# Patient Record
Sex: Female | Born: 1960 | Race: White | Hispanic: No | Marital: Married | Smoking: Never smoker
Health system: Southern US, Community
[De-identification: ages and names within clinical notes are randomized; demographics above are authoritative.]

## PROBLEM LIST (undated history)

## (undated) DIAGNOSIS — K219 Gastro-esophageal reflux disease without esophagitis: Secondary | ICD-10-CM

## (undated) DIAGNOSIS — B019 Varicella without complication: Secondary | ICD-10-CM

## (undated) DIAGNOSIS — L4050 Arthropathic psoriasis, unspecified: Secondary | ICD-10-CM

## (undated) DIAGNOSIS — M1990 Unspecified osteoarthritis, unspecified site: Secondary | ICD-10-CM

## (undated) DIAGNOSIS — H409 Unspecified glaucoma: Secondary | ICD-10-CM

## (undated) DIAGNOSIS — R7303 Prediabetes: Secondary | ICD-10-CM

## (undated) DIAGNOSIS — R599 Enlarged lymph nodes, unspecified: Secondary | ICD-10-CM

## (undated) HISTORY — DX: Arthropathic psoriasis, unspecified: L40.50

## (undated) HISTORY — DX: Gastro-esophageal reflux disease without esophagitis: K21.9

## (undated) HISTORY — DX: Enlarged lymph nodes, unspecified: R59.9

## (undated) HISTORY — DX: Varicella without complication: B01.9

## (undated) HISTORY — DX: Unspecified osteoarthritis, unspecified site: M19.90

---

## 1988-09-14 HISTORY — PX: OTHER SURGICAL HISTORY: SHX169

## 2000-01-09 ENCOUNTER — Encounter: Admission: RE | Admit: 2000-01-09 | Discharge: 2000-01-09 | Payer: Self-pay | Admitting: Family Medicine

## 2000-01-09 ENCOUNTER — Encounter: Payer: Self-pay | Admitting: Family Medicine

## 2000-10-29 ENCOUNTER — Encounter: Admission: RE | Admit: 2000-10-29 | Discharge: 2000-10-29 | Payer: Self-pay | Admitting: Family Medicine

## 2000-10-29 ENCOUNTER — Encounter: Payer: Self-pay | Admitting: Family Medicine

## 2000-11-30 ENCOUNTER — Other Ambulatory Visit: Admission: RE | Admit: 2000-11-30 | Discharge: 2000-11-30 | Payer: Self-pay | Admitting: Obstetrics and Gynecology

## 2002-04-18 ENCOUNTER — Encounter: Payer: Self-pay | Admitting: Family Medicine

## 2002-04-18 ENCOUNTER — Encounter: Admission: RE | Admit: 2002-04-18 | Discharge: 2002-04-18 | Payer: Self-pay | Admitting: Family Medicine

## 2002-09-14 HISTORY — PX: ANTERIOR CRUCIATE LIGAMENT REPAIR: SHX115

## 2004-09-14 HISTORY — PX: ANTERIOR CRUCIATE LIGAMENT REPAIR: SHX115

## 2005-01-19 ENCOUNTER — Encounter (INDEPENDENT_AMBULATORY_CARE_PROVIDER_SITE_OTHER): Payer: Self-pay | Admitting: *Deleted

## 2005-03-10 ENCOUNTER — Encounter: Admission: RE | Admit: 2005-03-10 | Discharge: 2005-03-10 | Payer: Self-pay | Admitting: Family Medicine

## 2005-10-20 ENCOUNTER — Encounter: Admission: RE | Admit: 2005-10-20 | Discharge: 2005-10-20 | Payer: Self-pay | Admitting: Internal Medicine

## 2006-10-22 ENCOUNTER — Encounter (INDEPENDENT_AMBULATORY_CARE_PROVIDER_SITE_OTHER): Payer: Self-pay | Admitting: *Deleted

## 2006-10-22 ENCOUNTER — Ambulatory Visit (HOSPITAL_COMMUNITY): Admission: RE | Admit: 2006-10-22 | Discharge: 2006-10-22 | Payer: Self-pay

## 2009-04-23 LAB — HM MAMMOGRAPHY: HM Mammogram: NORMAL

## 2009-07-17 ENCOUNTER — Ambulatory Visit: Payer: Self-pay | Admitting: Family Medicine

## 2009-07-17 DIAGNOSIS — R599 Enlarged lymph nodes, unspecified: Secondary | ICD-10-CM

## 2009-07-17 DIAGNOSIS — L4050 Arthropathic psoriasis, unspecified: Secondary | ICD-10-CM | POA: Insufficient documentation

## 2009-07-17 HISTORY — DX: Enlarged lymph nodes, unspecified: R59.9

## 2009-07-17 HISTORY — DX: Arthropathic psoriasis, unspecified: L40.50

## 2009-07-22 ENCOUNTER — Encounter (INDEPENDENT_AMBULATORY_CARE_PROVIDER_SITE_OTHER): Payer: Self-pay | Admitting: *Deleted

## 2009-08-13 ENCOUNTER — Encounter (INDEPENDENT_AMBULATORY_CARE_PROVIDER_SITE_OTHER): Payer: Self-pay | Admitting: *Deleted

## 2009-09-14 HISTORY — PX: COLONOSCOPY: SHX174

## 2010-01-15 ENCOUNTER — Ambulatory Visit: Payer: Self-pay | Admitting: Family Medicine

## 2010-01-15 DIAGNOSIS — S5010XA Contusion of unspecified forearm, initial encounter: Secondary | ICD-10-CM | POA: Insufficient documentation

## 2010-05-14 ENCOUNTER — Encounter (INDEPENDENT_AMBULATORY_CARE_PROVIDER_SITE_OTHER): Payer: Self-pay | Admitting: *Deleted

## 2010-07-17 ENCOUNTER — Encounter (INDEPENDENT_AMBULATORY_CARE_PROVIDER_SITE_OTHER): Payer: Self-pay | Admitting: *Deleted

## 2010-07-21 ENCOUNTER — Ambulatory Visit: Payer: Self-pay | Admitting: Gastroenterology

## 2010-08-01 ENCOUNTER — Ambulatory Visit: Payer: Self-pay | Admitting: Gastroenterology

## 2010-10-14 NOTE — Letter (Signed)
Summary: Moviprep Instructions  [facility]  [address]   [place], [place] [address]   Phone: [phone]  Fax: [phone]       Abigail Hayes    07-29-1961    MRN: [id number]        Procedure Day Dorna Bloom: Friday, 08-01-10     Arrival Time: 9:00 a.m.     Procedure Time: 10:00 a.m.     Location of Procedure:                    x  [facility] (4th Floor)                        PREPARATION FOR COLONOSCOPY WITH MOVIPREP   Starting 5 days prior to your procedure 07-27-10 do not eat nuts, seeds, popcorn, corn, beans, peas,  salads, or any raw vegetables.  Do not take any fiber supplements (e.g. Metamucil, Citrucel, and Benefiber).  THE DAY BEFORE YOUR PROCEDURE         DATE: 07-31-10   DAY: Thursday  1.  Drink clear liquids the entire day-NO SOLID FOOD  2.  Do not drink anything colored red or purple.  Avoid juices with pulp.  No orange juice.  3.  Drink at least 64 oz. (8 glasses) of fluid/clear liquids during the day to prevent dehydration and help the prep work efficiently.  CLEAR LIQUIDS INCLUDE: Water Jello Ice Popsicles Tea (sugar ok, no milk/cream) Powdered fruit flavored drinks Coffee (sugar ok, no milk/cream) Gatorade Juice: apple, white grape, white cranberry  Lemonade Clear bullion, consomm, broth Carbonated beverages (any kind) Strained chicken noodle soup Hard Candy                             4.  In the morning, mix first dose of MoviPrep solution:    Empty 1 Pouch A and 1 Pouch B into the disposable container    Add lukewarm drinking water to the top line of the container. Mix to dissolve    Refrigerate (mixed solution should be used within 24 hrs)  5.  Begin drinking the prep at 5:00 p.m. The MoviPrep container is divided by 4 marks.   Every 15 minutes drink the solution down to the next mark (approximately 8 oz) until the full liter is complete.   6.  Follow completed prep with 16 oz of clear liquid of your choice  (Nothing red or purple).  Continue to drink clear liquids until bedtime.  7.  Before going to bed, mix second dose of MoviPrep solution:    Empty 1 Pouch A and 1 Pouch B into the disposable container    Add lukewarm drinking water to the top line of the container. Mix to dissolve    Refrigerate  THE DAY OF YOUR PROCEDURE      DATE: 08-01-10  DAY: Friday  Beginning at 5:00 a.m. (5 hours before procedure):         1. Every 15 minutes, drink the solution down to the next mark (approx 8 oz) until the full liter is complete.  2. Follow completed prep with 16 oz. of clear liquid of your choice.    3. You Altamura drink clear liquids until  8:00 a.m.  (2 HOURS BEFORE PROCEDURE).   MEDICATION INSTRUCTIONS  Unless otherwise instructed, you should take regular prescription medications with a small sip of water   as early as possible  the morning of your procedure.          OTHER INSTRUCTIONS  You will need a responsible adult at least 51 years of age to accompany you and drive you home.   This person must remain in the waiting room during your procedure.  Wear loose fitting clothing that is easily removed.  Leave jewelry and other valuables at home.  However, you Masini wish to bring a book to read or  an iPod/MP3 player to listen to music as you wait for your procedure to start.  Remove all body piercing jewelry and leave at home.  Total time from sign-in until discharge is approximately 2-3 hours.  You should go home directly after your procedure and rest.  You can resume normal activities the  day after your procedure.  The day of your procedure you should not:   Drive   Make legal decisions   Operate machinery   Drink alcohol   Return to work  You will receive specific instructions about eating, activities and medications before you leave.    The above instructions have been reviewed and explained to me by   Ezra Sites RN  July 21, 2010 4:45 PM     I fully  understand and can verbalize these instructions _____________________________ Date _________

## 2010-10-14 NOTE — Procedures (Signed)
Summary: Colonoscopy  Patient: Cynde Catlin Note: All result statuses are Final unless otherwise noted.  Tests: (1) Colonoscopy (COL)   COL Colonoscopy           DONE     [facility]     [address]     [place], [place]  [address]           COLONOSCOPY PROCEDURE REPORT           PATIENT:  Selmon, Naraly  MR#:  [id number]     BIRTHDATE:  02-15-1961, 49 yrs. old  GENDER:  female     ENDOSCOPIST:  Rachael Fee, MD     REF. BY:  Harold Hedge, M.D.     PROCEDURE DATE:  08/01/2010     PROCEDURE:  Diagnostic Colonoscopy     ASA CLASS:  Class II     INDICATIONS:  Routine Risk Screening     MEDICATIONS:   Fentanyl 75 mcg IV, Versed 9 mg IV           DESCRIPTION OF PROCEDURE:   After the risks benefits and     alternatives of the procedure were thoroughly explained, informed     consent was obtained.  Digital rectal exam was performed and     revealed no rectal masses.   The LB 180AL [id number] endoscope was     introduced through the anus and advanced to the cecum, which was     identified by both the appendix and ileocecal valve, without     limitations.  The quality of the prep was good, using MoviPrep.     The instrument was then slowly withdrawn as the colon was fully     examined.     <<PROCEDUREIMAGES>>     FINDINGS:  External hemorrhoids were found. These were small and     not thrombosed.  This was otherwise a normal examination of the     colon (see image2, image3, and image4).   Retroflexed views in the     rectum revealed no abnormalities.    The scope was then withdrawn     from the patient and the procedure completed.     COMPLICATIONS:  None           ENDOSCOPIC IMPRESSION:     1) External hemorrhoids     2) Otherwise normal examination; no polyps or cancers           RECOMMENDATIONS:     1) You should continue to follow colorectal cancer screening     guidelines for "routine risk" patients with a repeat colonoscopy     in 10 years. There is no need for  FOBT (stool) testing for at     least 5 years.           REPEAT EXAM:  10 years           ______________________________     Rachael Fee, MD           n.     eSIGNED:   Rachael Fee at 08/01/2010 10:04 AM           Merian Capron, [phone]  Note: An exclamation mark (!) indicates a result that was not dispersed into the flowsheet. Document Creation Date: 08/01/2010 10:04 AM _______________________________________________________________________  (1) Order result status: Final Collection or observation date-time: 08/01/2010 10:01 Requested date-time:  Receipt date-time:  Reported date-time:  Referring Physician:   Ordering Physician: Rob Bunting [phone]) Specimen  Source:  Source: Launa Grill Order Number: [id number] Lab site:   Appended Document: Colonoscopy    Clinical Lists Changes  Observations: Added new observation of COLONNXTDUE: 07/2020 (08/01/2010 12:14)

## 2010-10-14 NOTE — Letter (Signed)
Summary: Pre Visit Letter Revised  [facility]  [address] [place], [place] [address]   Phone: [phone]  Fax: [phone]        05/14/2010 MRN: [id number] Abigail Hayes [address] [place], [place]  [address]             Procedure Date:  06/03/2010   Welcome to the Gastroenterology Division at [facility].    You are scheduled to see a nurse for your pre-procedure visit on 05/27/2010 at 3:30PM on the 3rd floor at [facility], [address]  We ask that you try to arrive at our office 15 minutes prior to your appointment time to allow for check-in.  Please take a minute to review the attached form.  If you answer "Yes" to one or more of the questions on the first page, we ask that you call the person listed at your earliest opportunity.  If you answer "No" to all of the questions, please complete the rest of the form and bring it to your appointment.    Your nurse visit will consist of discussing your medical and surgical history, your immediate family medical history, and your medications.   If you are unable to list all of your medications on the form, please bring the medication bottles to your appointment and we will list them.  We will need to be aware of both prescribed and over the counter drugs.  We will need to know exact dosage information as well.    Please be prepared to read and sign documents such as consent forms, a financial agreement, and acknowledgement forms.  If necessary, and with your consent, a friend or relative is welcome to sit-in on the nurse visit with you.  Please bring your insurance card so that we [place] make a copy of it.  If your insurance requires a referral to see a specialist, please bring your referral form from your primary care physician.  No co-pay is required for this nurse visit.     If you cannot keep your appointment, please call [phone] to cancel or reschedule prior to your appointment date.  This allows [place]  the opportunity to schedule an appointment for another patient in need of care.    Thank you for choosing [facility] for your medical needs.  We appreciate the opportunity to care for you.  Please visit [place] at our website  to learn more about our practice.  Sincerely, The Gastroenterology Division

## 2010-10-14 NOTE — Miscellaneous (Signed)
Summary: [facility]  Clinical Lists Changes  Medications: Added new medication of MOVIPREP 100 GM  SOLR (PEG-KCL-NACL-NASULF-NA ASC-C) As per prep instructions. - Signed Rx of MOVIPREP 100 GM  SOLR (PEG-KCL-NACL-NASULF-NA ASC-C) As per prep instructions.;  #1 x 0;  Signed;  Entered by: Ezra Sites RN;  Authorized by: Rachael Fee MD;  Method used: [facility]  [place] [address] [place], [place], [place]  [address], Ph: [phone] or [phone], Fax: [phone] Observations: Added new observation of NKA: T (07/21/2010 16:29)    Prescriptions: MOVIPREP 100 GM  SOLR (PEG-KCL-NACL-NASULF-NA ASC-C) As per prep instructions.  #1 x 0   Entered by:   Ezra Sites RN   Authorized by:   Rachael Fee MD   Signed by:   Ezra Sites RN on 07/21/2010   Method used:   [facility]        [facility]  [place] [address] (retail)       [address]       [place], [place]  [address]       Ph: [phone] or [phone]       Fax: [phone]   RxID:   [id number]

## 2010-10-14 NOTE — Assessment & Plan Note (Signed)
Summary: spot on arm/throat pain/njr   Vital Signs:  Patient profile:   50 year old female Menstrual status:  regular Temp:     98.6 degrees F oral BP sitting:   110 / 78  (left arm) Cuff size:   regular  Vitals Entered By: Sid Falcon LPN ([place]  4, 0981 2:41 PM) CC: spot on arm, throat pain   History of Present Illness: Patient seen with asymptomatic spot left forearm. First noted last week and after kayaking. No injury. Initially this looked darker in color now somewhat more reddish. No other rashes noted.  Also had some intermittent anterior neck pains after doing a number of sit-ups over a week ago. No pain with swallowing and no adenopathy noted. Question of small L supraclavicular node several months ago and patient saw ENT surgeon and eventually this resolved after antibiotic treatment. She had nasal laryngoscopy which was unremarkable.  Allergies (verified): No Known Drug Allergies  Past History:  Past Medical History: Last updated: 07/17/2009 Psoriatic Arthritis Chicken pox  Review of Systems  The patient denies anorexia, fever, weight loss, hoarseness, chest pain, syncope, prolonged cough, and headaches.    Physical Exam  General:  Well-developed,well-nourished,in no acute distress; alert,appropriate and cooperative throughout examination Head:  Normocephalic and atraumatic without obvious abnormalities. No apparent alopecia or balding. Eyes:  No corneal or conjunctival inflammation noted. EOMI. Perrla. Funduscopic exam benign, without hemorrhages, exudates or papilledema. Vision grossly normal. Ears:  External ear exam shows no significant lesions or deformities.  Otoscopic examination reveals clear canals, tympanic membranes are intact bilaterally without bulging, retraction, inflammation or discharge. Hearing is grossly normal bilaterally. Mouth:  Oral mucosa and oropharynx without lesions or exudates.  Teeth in good repair. Neck:  No deformities, masses, or  tenderness noted. Chest Wall:  nodes supraclavicular nodes noted Lungs:  Normal respiratory effort, chest expands symmetrically. Lungs are clear to auscultation, no crackles or wheezes. Heart:  Normal rate and regular rhythm. S1 and S2 normal without gallop, murmur, click, rub or other extra sounds. Skin:  left forearm 4 mm macular area of ecchymosis which is nontender.  No pigmentation.   Impression & Recommendations:  Problem # 1:  CONTUSION OF FOREARM (ICD-923.10) Assessment New reassurance given. Be in touch if this is not resolving over the next couple of weeks  Complete Medication List: 1)  Methotrexate 2.5 Mg Tabs (Methotrexate sodium) .... 6 pills per week 2)  Loestrin 1.5/30 (21) 1.5-30 Mg-mcg Tabs (Norethindrone acet-ethinyl est) .... Once daily as directed 3)  Folic Acid 400 Mcg Tabs (Folic acid) .... Once daily 4)  Vitamin D 400 Unit Caps (Cholecalciferol) .... Once daily 5)  Calcium 1500 Mg Tabs (Calcium carbonate) .... Once daily 6)  Womens One Daily Tabs (Multiple vitamins-minerals) .... Once daily

## 2011-01-30 NOTE — H&P (Signed)
NAMEMarland Kitchen  ONETHA, GAFFEY NO.:  [id number]   MEDICAL RECORD NO.:  [id number]          PATIENT TYPE:  AMB   LOCATION:  SDC                           [facility]   PHYSICIAN:  Guy Sandifer. [name], M.D. DATE OF BIRTH:  [birth year]   DATE OF ADMISSION:  DATE OF DISCHARGE:                              HISTORY & PHYSICAL   PREADMISSION HISTORY AND PHYSICAL   CHIEF COMPLAINT:  Heavy menses.   HISTORY OF PRESENT ILLNESS:  This patient is a 50 year old, married  white female, G3, P3, husband is status post vasectomy, who has had  heavy irregular menses.  Sonohystogram in my office on August 26, 2006, revealed the uterus measuring 9.7 x 5.4 x 7.0 cm.  Sonohystogram  is consistent with a 2.2 cm polypoid process in the endometrial cavity.  The ovaries appeared normal.  After discussion of the options, she is  being admitted for hysteroscopy with resectoscope and D&C.  Potential  risks and complications have been discussed with the patient  preoperatively.   PAST MEDICAL HISTORY:  Psoriatic arthritis.   PAST SURGICAL HISTORY:  1. Knee surgery and repair.  2. Laparoscopy with ovarian cystectomy.   MEDICATIONS:  Methotrexate, Celebrex, folic acid.  No known drug  allergies.   SOCIAL HISTORY:  Denied tobacco, alcohol, or drug abuse.   OBSTETRIC HISTORY:  Three vaginal deliveries.   FAMILY HISTORY:  Ovarian cancer in paternal aunt.   REVIEW OF SYSTEMS:  NEURO:  Denies headache.  CARDIAC:  No chest pain.  PULMONARY:  Denies shortness of breath.  GI:  Denies recent changes in  bowel habits.   PHYSICAL EXAMINATION:  LUNGS:  Clear to auscultation.  HEART:  Regular rate and rhythm.  BREASTS:  Not examined.  ABDOMEN:  Soft and nontender without masses.  PELVIC EXAM:  Vulva, vagina, and cervix without lesion, uterus upward  normal size, adnexa nontender without palpable masses.  EXTREMITIES:  Grossly within normal limits.  NEUROLOGICAL EXAM:  Grossly within normal  limits.   ASSESSMENT:  Menorrhagia.   PLAN:  Hysteroscopy with resectoscope, dilatation, and curettage.      Guy Sandifer [name], M.D.  Electronically Signed     JET/MEDQ  D:  10/18/2006  T:  10/18/2006  Job:  [id number]

## 2011-01-30 NOTE — Op Note (Signed)
NAMEMarland Hayes  ROBERT, SUNGA NO.:  [id number]   MEDICAL RECORD NO.:  [id number]          PATIENT TYPE:  AMB   LOCATION:  SDC                           [facility]   PHYSICIAN:  Guy Sandifer. [name], M.D. DATE OF BIRTH:  [birth year]   DATE OF PROCEDURE:  10/22/2006  DATE OF DISCHARGE:                               OPERATIVE REPORT   PREOPERATIVE DIAGNOSIS:  Menorrhagia.   POSTOPERATIVE DIAGNOSIS:  Menorrhagia.   PROCEDURE:  Hysteroscopy with resectoscope dilatation and curettage and  1% Xylocaine paracervical block.   SURGEON:  Guy Sandifer. [name], M.D.   ANESTHESIA:  General with LMA.   ESTIMATED BLOOD LOSS:  Minimal.   INPUTS AND OUTPUTS:  Sorbitol distending media 5 mL deficit.   INDICATIONS AND CONSENT:  This patient is a 50 year old married white  female G3, P3, whose husband is status post vasectomy, with increasingly  irregular and heavy menses.  The details are dictated history and  physical.  Hysteroscope with resectoscope and D&C has been discussed  with the patient preoperatively.  The potential risks and complications  have been discussed preoperatively including but limited to infection,  bowel, bladder, or ureteral damage, bleeding requiring transfusion of  blood products with possible transfusion reaction, HIV and hepatitis  acquisition, DVT, PE, pneumonia, recurrent heavy bleeding, laparoscopy,  laparotomy, and hysterectomy.  All questions have been answered and  consent is signed on the chart.   FINDINGS:  Fallopian tube ostia identified bilaterally.  There is a  prominent bump on the posterior endometrial canal.  No other abnormal  structures are noted.   DESCRIPTION OF PROCEDURE:  The patient is taken to operating room where  she is identified, placed in the dorsal supine position, and general  anesthesia is induced via LMA.  She is then placed in the dorsal  lithotomy position where she is prepped, bladder straight catheterized,  and she is  draped in a sterile fashion.  A bivalve speculum was placed  in vagina and the anterior cervical lip was injected with 1% Xylocaine  and grasped with a single tooth tenaculum.  A paracervical block with 1%  plain Xylocaine is placed at the 2, 4, 5, 7, 8, and 10 o'clock positions  with approximately 20 mL total of 1% Xylocaine plain.  The cervix was  gently progressively dilated to a 31 dilator.  The resectoscope with a  single right angle wire loop was placed in the endocervical canal and  advanced under direct visualization using sorbitol distending media.  The above findings were noted.  The prominent area which was broad based  on the posterior endometrial wall was resected.  This resects the entire  thickness of the endometrium and is superficial in the myometrium.  Good  hemostasis is noted.  Fragments of the resection are  removed.  The hysteroscope was removed and sharp curettage is carried  out.  Good hemostasis was noted.  The procedure was terminated.  All  counts were correct.  The patient is awakened and taken to the recovery  room in stable condition.  Guy Sandifer [name], M.D.  Electronically Signed     JET/MEDQ  D:  10/22/2006  T:  10/22/2006  Job:  [id number]

## 2011-06-03 ENCOUNTER — Other Ambulatory Visit: Payer: Self-pay | Admitting: Family Medicine

## 2011-06-03 MED ORDER — ZOLPIDEM TARTRATE 5 MG PO TABS: 5.0000 mg | ORAL_TABLET | Freq: Every evening | ORAL | Status: DC | PRN

## 2011-06-03 NOTE — Telephone Encounter (Signed)
Lowest dose is 5 mg.  Ambien 5 mg one po qhs prn, disp #15 with no refill.

## 2011-06-03 NOTE — Telephone Encounter (Addendum)
Pt will like lowest dose of ambien [facility] [phone]. Pt will be travelling internationally requesting med for sleep.

## 2011-06-22 ENCOUNTER — Other Ambulatory Visit (INDEPENDENT_AMBULATORY_CARE_PROVIDER_SITE_OTHER): Payer: Self-pay

## 2011-06-22 DIAGNOSIS — Z0000 Encounter for general adult medical examination without abnormal findings: Secondary | ICD-10-CM

## 2011-06-22 LAB — CBC WITH DIFFERENTIAL/PLATELET
Basophils Absolute: 0 10*3/uL (ref 0.0–0.1)
Basophils Relative: 0.4 % (ref 0.0–3.0)
Eosinophils Absolute: 0.1 10*3/uL (ref 0.0–0.7)
Hemoglobin: 13.3 g/dL (ref 12.0–15.0)
Lymphocytes Relative: 24.9 % (ref 12.0–46.0)
MCHC: 33.5 g/dL (ref 30.0–36.0)
MCV: 100.1 fl — ABNORMAL HIGH (ref 78.0–100.0)
Monocytes Absolute: 0.4 10*3/uL (ref 0.1–1.0)
Neutro Abs: 3.9 10*3/uL (ref 1.4–7.7)
Neutrophils Relative %: 66.6 % (ref 43.0–77.0)
RBC: 3.96 Mil/uL (ref 3.87–5.11)
RDW: 12.5 % (ref 11.5–14.6)

## 2011-06-22 LAB — HEPATIC FUNCTION PANEL
ALT: 14 U/L (ref 0–35)
AST: 16 U/L (ref 0–37)
Bilirubin, Direct: 0 mg/dL (ref 0.0–0.3)
Total Bilirubin: 0.5 mg/dL (ref 0.3–1.2)
Total Protein: 7.6 g/dL (ref 6.0–8.3)

## 2011-06-22 LAB — BASIC METABOLIC PANEL
CO2: 26 mEq/L (ref 19–32)
Calcium: 8.7 mg/dL (ref 8.4–10.5)
Chloride: 105 mEq/L (ref 96–112)
Creatinine, Ser: 0.8 mg/dL (ref 0.4–1.2)
Glucose, Bld: 87 mg/dL (ref 70–99)

## 2011-06-22 LAB — LIPID PANEL: HDL: 81.1 mg/dL (ref >39.00)

## 2011-06-22 LAB — POCT URINALYSIS DIPSTICK
Glucose, UA: NEGATIVE
Nitrite, UA: NEGATIVE
Spec Grav, UA: 1.025
Urobilinogen, UA: 0.2
pH, UA: 5

## 2011-06-24 ENCOUNTER — Encounter: Payer: Self-pay | Admitting: Family Medicine

## 2011-06-24 ENCOUNTER — Ambulatory Visit (INDEPENDENT_AMBULATORY_CARE_PROVIDER_SITE_OTHER): Payer: BC Managed Care – PPO | Admitting: Family Medicine

## 2011-06-24 VITALS — BP 120/84 | HR 96 | Temp 98.7°F | Resp 12 | Ht 64.0 in | Wt 159.0 lb

## 2011-06-24 DIAGNOSIS — R319 Hematuria, unspecified: Secondary | ICD-10-CM

## 2011-06-24 DIAGNOSIS — Z0000 Encounter for general adult medical examination without abnormal findings: Secondary | ICD-10-CM

## 2011-06-24 LAB — POCT URINALYSIS DIPSTICK
Bilirubin, UA: NEGATIVE
Glucose, UA: NEGATIVE
Nitrite, UA: NEGATIVE
Spec Grav, UA: 1.02
Urobilinogen, UA: 0.2

## 2011-06-24 NOTE — Progress Notes (Signed)
  Subjective:    Patient ID: Abigail Hayes, female    DOB: 1961/07/26, 50 y.o.   MRN: [id number]  HPI Complete physical. She sees a gynecologist regularly and has scheduled followup next month. History of psoriatic arthritis. Scheduled to see a rheumatologist soon. Takes methotrexate. No flu vaccine thus far.  Recent problems with weight gain. Not exercising regularly. Increased alcohol consumption generally about 4 days per week 3 drinks per day. She knows she needs to scale back. No previous problems with abuse. Nonsmoker.  Past Medical History  Diagnosis Date  . PSORIATIC ARTHRITIS 07/17/2009  . Enlargement of lymph nodes 07/17/2009  . Chicken pox    Past Surgical History  Procedure Date  . Anterior cruciate ligament repair 2006    reports that she has never smoked. She does not have any smokeless tobacco history on file. Her alcohol and drug histories not on file. family history includes Cancer in her other; Heart disease in her other; Hyperlipidemia in her other; and Hypertension in her other. No Known Allergies    Review of Systems  Constitutional: Negative for fever, activity change, appetite change, fatigue and unexpected weight change.  HENT: Negative for hearing loss, ear pain, sore throat and trouble swallowing.   Eyes: Negative for visual disturbance.  Respiratory: Negative for cough and shortness of breath.   Cardiovascular: Negative for chest pain and palpitations.  Gastrointestinal: Negative for abdominal pain, diarrhea, constipation and blood in stool.  Genitourinary: Negative for dysuria and hematuria.  Musculoskeletal: Positive for arthralgias. Negative for myalgias, back pain, joint swelling and gait problem.  Skin: Negative for rash.  Neurological: Negative for dizziness, syncope, weakness and headaches.  Hematological: Negative for adenopathy.  Psychiatric/Behavioral: Negative for confusion and dysphoric mood.       Objective:   Physical Exam  Constitutional:  She is oriented to person, place, and time. She appears well-developed and well-nourished.  HENT:  Head: Normocephalic and atraumatic.  Eyes: EOM are normal. Pupils are equal, round, and reactive to light.  Neck: Normal range of motion. Neck supple. No thyromegaly present.  Cardiovascular: Normal rate, regular rhythm and normal heart sounds.   No murmur heard. Pulmonary/Chest: Breath sounds normal. No respiratory distress. She has no wheezes. She has no rales.  Abdominal: Soft. Bowel sounds are normal. She exhibits no distension and no mass. There is no tenderness. There is no rebound and no guarding.  Genitourinary:       As per GYN  Musculoskeletal: Normal range of motion. She exhibits no edema.  Lymphadenopathy:    She has no cervical adenopathy.  Neurological: She is alert and oriented to person, place, and time. She displays normal reflexes. No cranial nerve deficit.  Skin: No rash noted.  Psychiatric: She has a normal mood and affect. Her behavior is normal. Judgment and thought content normal.          Assessment & Plan:  Health maintenance. Labs reviewed with patient. Blood on urine dipstick. Repeat urinalysis today. Flu vaccine given. Encouraged to consider Pneumovax and patient uncertain at this time. Continue GYN followup. More consistent exercise. Reduce alcohol consumption.  Repeat urine today shows 1+ blood on urine dipstick. No leukocytes or nitrites. Repeat urine in 2 weeks and if still hematuria that point consider urology referral

## 2011-06-24 NOTE — Patient Instructions (Signed)
Reduce alcohol consumption Establish more consistent exercise Consider Pneumovax (pneumonia vaccine)

## 2011-07-08 ENCOUNTER — Other Ambulatory Visit (INDEPENDENT_AMBULATORY_CARE_PROVIDER_SITE_OTHER): Payer: BC Managed Care – PPO

## 2011-07-08 DIAGNOSIS — R319 Hematuria, unspecified: Secondary | ICD-10-CM

## 2011-07-08 LAB — POCT URINALYSIS DIPSTICK
Leukocytes, UA: NEGATIVE
Protein, UA: NEGATIVE
Urobilinogen, UA: 0.2
pH, UA: 5.5

## 2011-07-09 NOTE — Progress Notes (Signed)
Quick Note:  Pt informed on home VM ______ 

## 2011-12-08 ENCOUNTER — Telehealth: Payer: Self-pay

## 2011-12-08 NOTE — Telephone Encounter (Signed)
Called pt and she is aware  ?

## 2011-12-08 NOTE — Telephone Encounter (Signed)
Recommend evaluation.

## 2011-12-08 NOTE — Telephone Encounter (Signed)
Pt called and states back in October 2012 pt had mentioned her right ankle.  Now pt's right ankle and shin are very painful when turned.  Pt denies any injuries. Pt states she has arthritis in all her joints.  Pt would like to have an x-ray of her right ankle.    Pt was also in the [place] last week and has had diarrhea every since last Thursday.  Pt denies any fever, cough, etc.  Pt states she is trying to stay well hydrated but it is difficult to eat due to having cramps in her lower abdomen.  Pt has been taking imodium which is helping some.  Pt states some other people that went on the trip were treated with Cipro and are feeling much better.  Pls advise. Pt aware Dr. Caryl Never is out of the office for the remainder of the day.

## 2011-12-09 ENCOUNTER — Encounter: Payer: Self-pay | Admitting: Family Medicine

## 2011-12-09 ENCOUNTER — Ambulatory Visit (INDEPENDENT_AMBULATORY_CARE_PROVIDER_SITE_OTHER): Payer: BC Managed Care – PPO | Admitting: Family Medicine

## 2011-12-09 VITALS — BP 140/84 | Temp 98.1°F | Wt 162.0 lb

## 2011-12-09 DIAGNOSIS — R197 Diarrhea, unspecified: Secondary | ICD-10-CM

## 2011-12-09 DIAGNOSIS — M25571 Pain in right ankle and joints of right foot: Secondary | ICD-10-CM

## 2011-12-09 DIAGNOSIS — M25579 Pain in unspecified ankle and joints of unspecified foot: Secondary | ICD-10-CM

## 2011-12-09 MED ORDER — CIPROFLOXACIN HCL 500 MG PO TABS: 500.0000 mg | ORAL_TABLET | Freq: Two times a day (BID) | ORAL | Status: AC

## 2011-12-09 NOTE — Patient Instructions (Signed)
Foods Rich in Potassium Food / Potassium (mg)  Apricots, dried,  cup / 378 mg   Apricots, raw, 1 cup halves / 401 mg   Avocado,  / 487 mg   Banana, 1 large / 487 mg   Beef, lean, round, 3 oz / 202 mg   Cantaloupe, 1 cup cubes / 427 mg   Dates, medjool, 5 whole / 835 mg   Ham, cured, 3 oz / 212 mg   Lentils, dried,  cup / 458 mg   Lima beans, frozen,  cup / 258 mg   Orange, 1 large / 333 mg   Orange juice, 1 cup / 443 mg   Peaches, dried,  cup / 398 mg   Peas, split, cooked,  cup / 355 mg   Potato, boiled, 1 medium / 515 mg   Prunes, dried, uncooked,  cup / 318 mg   Raisins,  cup / 309 mg   Salmon, pink, raw, 3 oz / 275 mg   Sardines, canned , 3 oz / 338 mg   Tomato, raw, 1 medium / 292 mg   Tomato juice, 6 oz / 417 mg   Turkey, 3 oz / 349 mg  Document Released: 08/31/2005 Document Revised: 05/13/2011 Document Reviewed: 01/14/2009 Exit[employer] Patient Information 2012 [employer]. 

## 2011-12-09 NOTE — Progress Notes (Signed)
  Subjective:    Patient ID: Abigail Hayes, female    DOB: 12-21-1960, 51 y.o.   MRN: [id number]  HPI  Patient seen for acute issues. Diarrhea roughly one week duration. Just returned from the [place]. Several co-travelers had diarrhea which apparently responded to Cipro. She has nonbloody diarrhea usually a few stools per day. No nausea or vomiting. Intermittent abdominal cramps. Took a couple doses of Imodium with some relief. Denies any fever or chills.  No recent antibiotic use  Intermittent right ankle pain. History of psoriatic arthritis followed by rheumatologist. No swelling. Pain with planting the right foot and twisting. Pain is poorly localized but mostly anterior ankle and distal tibia.. No bony tenderness. No ecchymosis. No weakness. Location is mostly anterior ankle.  She remains on methotrexate per rheumatology.  Past Medical History  Diagnosis Date  . PSORIATIC ARTHRITIS 07/17/2009  . Enlargement of lymph nodes 07/17/2009  . Chicken pox    Past Surgical History  Procedure Date  . Anterior cruciate ligament repair 2006    reports that she has never smoked. She does not have any smokeless tobacco history on file. Her alcohol and drug histories not on file. family history includes Cancer in her other; Heart disease in her other; Hyperlipidemia in her other; and Hypertension in her other. No Known Allergies     Review of Systems  Constitutional: Negative for fever and chills.  Gastrointestinal: Positive for diarrhea. Negative for nausea, vomiting, constipation and blood in stool.  Genitourinary: Negative for dysuria.  Musculoskeletal: Positive for arthralgias (right ankle pain). Negative for myalgias and joint swelling.  Skin: Negative for rash.       Objective:   Physical Exam  HENT:  Mouth/Throat: Oropharynx is clear and moist.  Cardiovascular: Normal rate and regular rhythm.   Pulmonary/Chest: Effort normal and breath sounds normal.  Abdominal: Soft. She  exhibits no distension. There is no tenderness.  Musculoskeletal: She exhibits no edema.       Right ankle reveals full range of motion. No warmth. No erythema. No ecchymosis. No localized bony tenderness. Achilles tendon is intact and nontender. Good and normal distal foot pulses          Assessment & Plan:  #1 diarrhea. Given recent travel start Cipro 500 mg twice a day for 3 days. Followup promptly for fever or bloody stool or worsening symptoms #2 right ankle pain. No signs of inflammatory change. Symptoms are intermittent. Observe for now. Start with x-rays if persists.

## 2011-12-16 ENCOUNTER — Telehealth: Payer: Self-pay | Admitting: *Deleted

## 2011-12-16 NOTE — Telephone Encounter (Signed)
Pt needs OV.  We will get her in tomorrow, VM left we will schedule for 3:30pm.

## 2011-12-16 NOTE — Telephone Encounter (Signed)
(  triage voicemail)  Pt states she has very bad blistering up her arms that's oozing as a result of what she believes is poison ivy. She wants to know what she should do as she is leaving to go out of town on Friday for a week.

## 2011-12-17 ENCOUNTER — Ambulatory Visit: Payer: BC Managed Care – PPO | Admitting: Family Medicine

## 2012-04-19 ENCOUNTER — Ambulatory Visit (INDEPENDENT_AMBULATORY_CARE_PROVIDER_SITE_OTHER): Payer: BC Managed Care – PPO | Admitting: Family Medicine

## 2012-04-19 ENCOUNTER — Encounter: Payer: Self-pay | Admitting: Family Medicine

## 2012-04-19 VITALS — BP 144/78 | Temp 98.6°F | Wt 160.0 lb

## 2012-04-19 DIAGNOSIS — R05 Cough: Secondary | ICD-10-CM

## 2012-04-19 DIAGNOSIS — R059 Cough, unspecified: Secondary | ICD-10-CM

## 2012-04-19 DIAGNOSIS — R053 Chronic cough: Secondary | ICD-10-CM

## 2012-04-19 MED ORDER — AMOXICILLIN-POT CLAVULANATE 875-125 MG PO TABS: 1.0000 | ORAL_TABLET | Freq: Two times a day (BID) | ORAL | Status: AC

## 2012-04-19 NOTE — Patient Instructions (Addendum)
Call in 2 weeks if cough not improving.

## 2012-04-19 NOTE — Progress Notes (Signed)
  Subjective:    Patient ID: Abigail Hayes, female    DOB: 1961/01/15, 51 y.o.   MRN: [id number]  HPI  Patient is a nonsmoker with history of psoriatic arthritis on methotrexate. Presents with several weeks of cough. Initially started with cold-like symptoms. Cough productive of yellow sputum. Over the past week she has developed some maxillary facial pain and pressure. Some postnasal drip. Increased fatigue especially over the past couple of weeks. She tried Mucinex DM and Sudafed without much relief. Denies any fever, appetite change, headache, weight loss, GERD symptoms, wheezing, or any dyspnea. Was seen by rheumatologist couple weeks ago and lung exam apparently normal at that time. No recent antibiotics.  Review of Systems  Constitutional: Negative for fever, chills, appetite change and unexpected weight change.  HENT: Positive for sinus pressure. Negative for postnasal drip.   Respiratory: Positive for cough. Negative for shortness of breath and wheezing.   Cardiovascular: Negative for chest pain, palpitations and leg swelling.  Neurological: Negative for syncope.  Hematological: Negative for adenopathy. Does not bruise/bleed easily.       Objective:   Physical Exam  Constitutional: She appears well-developed and well-nourished.  HENT:  Right Ear: External ear normal.  Left Ear: External ear normal.  Mouth/Throat: Oropharynx is clear and moist.       Nasal mucosa slightly inflamed bilaterally  Neck: Neck supple. No thyromegaly present.  Cardiovascular: Normal rate and regular rhythm.   Pulmonary/Chest: Effort normal and breath sounds normal. No respiratory distress. She has no wheezes. She has no rales.  Musculoskeletal: She exhibits no edema.          Assessment & Plan:  Persistent cough. Question acute versus chronic sinusitis. Given duration, Augmentin 875 mg twice daily for 10 days. Touch base in 2 weeks if cough not resolving. Chest x-ray and further evaluation then if no  better. She does not have evidence for GERD, reactive airway disease, or allergic postnasal drip issues

## 2012-10-20 ENCOUNTER — Other Ambulatory Visit: Payer: Self-pay | Admitting: Obstetrics and Gynecology

## 2012-10-20 DIAGNOSIS — R928 Other abnormal and inconclusive findings on diagnostic imaging of breast: Secondary | ICD-10-CM

## 2012-11-02 ENCOUNTER — Ambulatory Visit
Admission: RE | Admit: 2012-11-02 | Discharge: 2012-11-02 | Disposition: A | Discharge status: Home / Self Care | Payer: BC Managed Care – PPO | Source: Ambulatory Visit | Attending: Obstetrics and Gynecology | Admitting: Obstetrics and Gynecology

## 2012-11-02 DIAGNOSIS — R928 Other abnormal and inconclusive findings on diagnostic imaging of breast: Secondary | ICD-10-CM

## 2012-11-15 ENCOUNTER — Encounter: Payer: Self-pay | Admitting: Gastroenterology

## 2012-11-15 ENCOUNTER — Ambulatory Visit (INDEPENDENT_AMBULATORY_CARE_PROVIDER_SITE_OTHER): Payer: BC Managed Care – PPO | Admitting: Gastroenterology

## 2012-11-15 VITALS — BP 130/84 | HR 84 | Ht 63.75 in | Wt 160.8 lb

## 2012-11-15 DIAGNOSIS — R195 Other fecal abnormalities: Secondary | ICD-10-CM

## 2012-11-15 NOTE — Progress Notes (Signed)
Review of pertinent gastrointestinal problems: 1. Routine risk for colon cancer: Colonoscopy 07/2010([facility]) for routine screening found external hemorrhoids only, no polyps. Recommendations stated "You should continue to follow colorectal cancer screening guidelines for "routine risk" patients with a repeat colonoscopy in 10 years. There is no need for FOBT (stool) testing for at least 5 years."  She however underwent Hemosure testing in early 2014 ``and it was positive.  HPI: This is a    very pleasant 52 year old woman whom I last saw a little over 2 years ago at the time of a colonoscopy. This is the first time I am meeting her in the office.  Was having routine exam with her gynecologist. She was given stool cards.   A "litty bitty bitty spot turned blue" per patient.  Was given 3 cards, all three found "very small amount of blue on them."  Dr. Harold Hedge.  Her father diagnosed with esophageal cancer 1-2 years.  Her bowels have not changed in many many years. Always a bit loose.  Eats a lot of fruits and vegetables. Spinach.  Overall her weight has been stable.     Review of systems: Pertinent positive and negative review of systems were noted in the above HPI section. Complete review of systems was performed and was otherwise normal.    Past Medical History  Diagnosis Date  . PSORIATIC ARTHRITIS 07/17/2009  . Enlargement of lymph nodes 07/17/2009  . Chicken pox     Past Surgical History  Procedure Laterality Date  . Anterior cruciate ligament repair  2006    Current Outpatient Prescriptions  Medication Sig Dispense Refill  . ibuprofen (ADVIL,MOTRIN) 200 MG tablet Take 200 mg by mouth every 6 (six) hours as needed for pain.      . Omega-3 Fatty Acids (FISH OIL) 1200 MG CAPS Take 1 capsule by mouth daily.       No current facility-administered medications for this visit.    Allergies as of 11/15/2012  . (No Known Allergies)    Family History  Problem Relation Age  of Onset  . Hyperlipidemia Other   . Hypertension Other   . Cancer Other     ovarian, throat  . Heart disease Other     History   Social History  . Marital Status: Married    Spouse Name: N/A    Number of Children: 3  . Years of Education: N/A   Occupational History  . Marketing    Social History Main Topics  . Smoking status: Never Smoker   . Smokeless tobacco: Never Used  . Alcohol Use: Yes  . Drug Use: No  . Sexually Active: Not on file   Other Topics Concern  . Not on file   Social History Narrative  . No narrative on file       Physical Exam: BP 130/84  Pulse 84  Ht 5' 3.75" (1.619 m)  Wt 160 lb 12.8 oz (72.938 kg)  BMI 27.83 kg/m2  LMP 06/03/2011 Constitutional: generally well-appearing Psychiatric: alert and oriented x3 Eyes: extraocular movements intact Mouth: oral pharynx moist, no lesions Neck: supple no lymphadenopathy Cardiovascular: heart regular rate and rhythm Lungs: clear to auscultation bilaterally Abdomen: soft, nontender, nondistended, no obvious ascites, no peritoneal signs, normal bowel sounds Extremities: no lower extremity edema bilaterally Skin: no lesions on visible extremities    Assessment and plan: 52 y.o. female with  Hemoccult-positive stool, colonoscopy just over 2 years ago found no polyps  I think she is being over screening  for colon cancer. She had colonoscopy November 2011 no polyps were found. She would not need repeat colon cancer screening really for 10 years. We now have this positive Hemoccult testing results and she and I discussed the pros and cons of repeating colonoscopy. In the end she decided to complete 3 more sets of stool cards with special attention to the instructions and if any of those are positive then we would proceed with colonoscopy. If these are all negative then she knows to decline any interim colon cancer screening until the time of her next scheduled colonoscopy which would be 2021

## 2012-11-15 NOTE — Patient Instructions (Addendum)
Three sets of FOBT cards to be completed at home with attention to instructions. If any are positive then we will proceed with colonoscopy.                                              We are excited to introduce MyChart, a new best-in-class service that provides you online access to important information in your electronic medical record. We want to make it easier for you to view your health information - all in one secure location - when and where you need it. We expect MyChart will enhance the quality of care and service we provide.  When you register for MyChart, you can:    View your test results.    Request appointments and receive appointment reminders via email.    Request medication renewals.    View your medical history, allergies, medications and immunizations.    Communicate with your physician's office through a password-protected site.    Conveniently print information such as your medication lists.  To find out if MyChart is right for you, please talk to a member of our clinical staff today. We will gladly answer your questions about this free health and wellness tool.  If you are age 53 or older and want a member of your family to have access to your record, you must provide written consent by completing a proxy form available at our office. Please speak to our clinical staff about guidelines regarding accounts for patients younger than age 79.  As you activate your MyChart account and need any technical assistance, please call the MyChart technical support line at [phone]-CHART 301-125-1989) or email your question to [email or web address]. If you email your question(s), please include your name, a return phone number and the best time to reach you.  If you have non-urgent health-related questions, you can send a message to our office through MyChart at [email or web address]. If you have a medical emergency, call 911.  Thank you for using MyChart as your new health  and wellness resource!   MyChart licensed from [employer],  [id number]. Patents Pending.

## 2012-11-29 ENCOUNTER — Other Ambulatory Visit: Payer: Self-pay | Admitting: Internal Medicine

## 2012-11-29 DIAGNOSIS — R911 Solitary pulmonary nodule: Secondary | ICD-10-CM

## 2012-11-30 ENCOUNTER — Other Ambulatory Visit: Payer: BC Managed Care – PPO

## 2012-11-30 ENCOUNTER — Other Ambulatory Visit: Payer: Self-pay | Admitting: *Deleted

## 2012-11-30 ENCOUNTER — Other Ambulatory Visit (INDEPENDENT_AMBULATORY_CARE_PROVIDER_SITE_OTHER): Payer: BC Managed Care – PPO

## 2012-11-30 DIAGNOSIS — R195 Other fecal abnormalities: Secondary | ICD-10-CM

## 2012-11-30 DIAGNOSIS — K625 Hemorrhage of anus and rectum: Secondary | ICD-10-CM

## 2012-11-30 LAB — HEMOCCULT SLIDES (X 3 CARDS)
Fecal Occult Blood: NEGATIVE
OCCULT 1: NEGATIVE
OCCULT 2: NEGATIVE
OCCULT 3: NEGATIVE
OCCULT 4: NEGATIVE
OCCULT 5: NEGATIVE
OCCULT 5: NEGATIVE

## 2012-12-16 ENCOUNTER — Ambulatory Visit
Admission: RE | Admit: 2012-12-16 | Discharge: 2012-12-16 | Disposition: A | Discharge status: Home / Self Care | Payer: BC Managed Care – PPO | Source: Ambulatory Visit | Attending: Internal Medicine | Admitting: Internal Medicine

## 2012-12-16 DIAGNOSIS — R911 Solitary pulmonary nodule: Secondary | ICD-10-CM

## 2013-05-22 NOTE — Addendum Note (Signed)
Addended by: Zyriah Mask W on: 05/22/2013 02:10 PM   Modules accepted: Orders  

## 2013-06-02 ENCOUNTER — Encounter: Payer: Self-pay | Admitting: Family Medicine

## 2013-06-02 ENCOUNTER — Ambulatory Visit (INDEPENDENT_AMBULATORY_CARE_PROVIDER_SITE_OTHER): Payer: BC Managed Care – PPO | Admitting: Family Medicine

## 2013-06-02 VITALS — BP 130/70 | HR 66 | Temp 98.3°F | Wt 155.0 lb

## 2013-06-02 DIAGNOSIS — R1013 Epigastric pain: Secondary | ICD-10-CM

## 2013-06-02 MED ORDER — PANTOPRAZOLE SODIUM 40 MG PO TBEC: 40.0000 mg | DELAYED_RELEASE_TABLET | Freq: Every day | ORAL | Status: DC

## 2013-06-02 NOTE — Progress Notes (Signed)
  Subjective:    Patient ID: Abigail Hayes, female    DOB: 09-12-1961, 52 y.o.   MRN: [id number]  HPI Patient seen with concern for possible" ulcer" She's never had any history of peptic ulcer disease. She relates a one week history of midepigastric pain. Pain is moderate. She has been using considerable nonsteroidals recently. She drinks about one glass of wine 3 or 4 days per week. Nonsmoker. Pain comes and goes. Actually improved with eating. She's had some decreased appetite. She's lost some weight during the past year due to her efforts. No melena. No vomiting. No dysphagia. No radiation of pain. Pain occasionally waking her up at night. She has taken TUMS with mild relief.  Past Medical History  Diagnosis Date  . PSORIATIC ARTHRITIS 07/17/2009  . Enlargement of lymph nodes 07/17/2009  . Chicken pox    Past Surgical History  Procedure Laterality Date  . Anterior cruciate ligament repair  2006    reports that she has never smoked. She has never used smokeless tobacco. She reports that  drinks alcohol. She reports that she does not use illicit drugs. family history includes Cancer in her other; Heart disease in her other; Hyperlipidemia in her other; Hypertension in her other. No Known Allergies    Review of Systems  Constitutional: Negative for fever, chills and appetite change.  HENT: Negative for trouble swallowing.   Respiratory: Negative for cough and shortness of breath.   Cardiovascular: Negative for chest pain.  Gastrointestinal: Positive for abdominal pain. Negative for nausea, vomiting, diarrhea, constipation and blood in stool.  Neurological: Negative for dizziness and syncope.       Objective:   Physical Exam  Constitutional: She appears well-developed and well-nourished.  HENT:  Mouth/Throat: Oropharynx is clear and moist.  Cardiovascular: Normal rate and regular rhythm.   Pulmonary/Chest: Effort normal and breath sounds normal. No respiratory distress. She has no  wheezes. She has no rales.  Abdominal: Soft. Bowel sounds are normal. She exhibits no distension and no mass. There is tenderness. There is no rebound and no guarding.  Minimal tenderness midepigastric region          Assessment & Plan:  Epigastric abdominal pain. Differential is gastritis versus possible peptic ulcer disease. Stop all nonsteroidals. Avoid alcohol for now. Start Protonix 40 mg once daily. Followup promptly for any melena, dizziness, vomiting. Touch base and in one to 2 weeks if symptoms not improving. She will plan on 4-6 weeks of therapy then taper off if symptoms are resolving She is aware we will need to further evaluate with UGI or endoscopy if not improving over the next couple of weeks.

## 2013-06-02 NOTE — Patient Instructions (Addendum)
Avoid regular use of NSAIDS Follow up promptly for any any vomiting, melena (black stools), or if pain not improving over next couple of weeks.

## 2013-10-17 ENCOUNTER — Other Ambulatory Visit: Payer: Self-pay

## 2013-10-17 DIAGNOSIS — Z1231 Encounter for screening mammogram for malignant neoplasm of breast: Secondary | ICD-10-CM

## 2013-10-31 ENCOUNTER — Other Ambulatory Visit: Payer: BC Managed Care – PPO

## 2013-11-01 ENCOUNTER — Other Ambulatory Visit (INDEPENDENT_AMBULATORY_CARE_PROVIDER_SITE_OTHER): Payer: BC Managed Care – PPO

## 2013-11-01 DIAGNOSIS — Z0000 Encounter for general adult medical examination without abnormal findings: Secondary | ICD-10-CM

## 2013-11-01 LAB — CBC WITH DIFFERENTIAL/PLATELET
BASOS ABS: 0.1 10*3/uL (ref 0.0–0.1)
BASOS PCT: 1.1 % (ref 0.0–3.0)
EOS PCT: 3.5 % (ref 0.0–5.0)
Eosinophils Absolute: 0.2 10*3/uL (ref 0.0–0.7)
HEMATOCRIT: 43 % (ref 36.0–46.0)
HEMOGLOBIN: 14.3 g/dL (ref 12.0–15.0)
LYMPHS ABS: 2.4 10*3/uL (ref 0.7–4.0)
LYMPHS PCT: 35.7 % (ref 12.0–46.0)
MCHC: 33.1 g/dL (ref 30.0–36.0)
MCV: 99.1 fl (ref 78.0–100.0)
MONOS PCT: 6.2 % (ref 3.0–12.0)
Monocytes Absolute: 0.4 10*3/uL (ref 0.1–1.0)
NEUTROS ABS: 3.7 10*3/uL (ref 1.4–7.7)
Neutrophils Relative %: 53.5 % (ref 43.0–77.0)
Platelets: 366 10*3/uL (ref 150.0–400.0)
RBC: 4.34 Mil/uL (ref 3.87–5.11)
RDW: 12.9 % (ref 11.5–14.6)
WBC: 6.8 10*3/uL (ref 4.5–10.5)

## 2013-11-01 LAB — BASIC METABOLIC PANEL
BUN: 12 mg/dL (ref 6–23)
CALCIUM: 9.4 mg/dL (ref 8.4–10.5)
CO2: 27 mEq/L (ref 19–32)
Chloride: 102 mEq/L (ref 96–112)
Creatinine, Ser: 0.7 mg/dL (ref 0.4–1.2)
GFR: 97.67 mL/min (ref >60.00)
GLUCOSE: 90 mg/dL (ref 70–99)
POTASSIUM: 4.9 meq/L (ref 3.5–5.1)
SODIUM: 137 meq/L (ref 135–145)

## 2013-11-01 LAB — POCT URINALYSIS DIPSTICK
Bilirubin, UA: NEGATIVE
GLUCOSE UA: NEGATIVE
Ketones, UA: NEGATIVE
NITRITE UA: NEGATIVE
PH UA: 7
Protein, UA: NEGATIVE
RBC UA: NEGATIVE
SPEC GRAV UA: 1.015
UROBILINOGEN UA: 0.2

## 2013-11-01 LAB — LIPID PANEL
CHOL/HDL RATIO: 2
Cholesterol: 219 mg/dL — ABNORMAL HIGH (ref 0–200)
HDL: 100.4 mg/dL (ref >39.00)
TRIGLYCERIDES: 78 mg/dL (ref 0.0–149.0)
VLDL: 15.6 mg/dL (ref 0.0–40.0)

## 2013-11-01 LAB — HEPATIC FUNCTION PANEL
ALBUMIN: 4 g/dL (ref 3.5–5.2)
ALK PHOS: 80 U/L (ref 39–117)
ALT: 15 U/L (ref 0–35)
AST: 16 U/L (ref 0–37)
Bilirubin, Direct: 0 mg/dL (ref 0.0–0.3)
TOTAL PROTEIN: 7.9 g/dL (ref 6.0–8.3)
Total Bilirubin: 0.7 mg/dL (ref 0.3–1.2)

## 2013-11-01 LAB — LDL CHOLESTEROL, DIRECT: LDL DIRECT: 100.5 mg/dL

## 2013-11-01 LAB — TSH: TSH: 1.78 u[IU]/mL (ref 0.35–5.50)

## 2013-11-02 LAB — HM PAP SMEAR: HM Pap smear: NORMAL

## 2013-11-03 ENCOUNTER — Ambulatory Visit: Payer: BC Managed Care – PPO

## 2013-11-03 ENCOUNTER — Ambulatory Visit
Admission: RE | Admit: 2013-11-03 | Discharge: 2013-11-03 | Disposition: A | Discharge status: Home / Self Care | Payer: BC Managed Care – PPO | Source: Ambulatory Visit

## 2013-11-03 DIAGNOSIS — Z1231 Encounter for screening mammogram for malignant neoplasm of breast: Secondary | ICD-10-CM

## 2013-11-03 LAB — HM MAMMOGRAPHY: HM MAMMO: NORMAL

## 2013-11-07 ENCOUNTER — Encounter: Payer: BC Managed Care – PPO | Admitting: Family Medicine

## 2013-11-08 ENCOUNTER — Encounter: Payer: Self-pay | Admitting: Family Medicine

## 2013-11-08 ENCOUNTER — Ambulatory Visit (INDEPENDENT_AMBULATORY_CARE_PROVIDER_SITE_OTHER): Payer: BC Managed Care – PPO | Admitting: Family Medicine

## 2013-11-08 VITALS — BP 128/80 | HR 70 | Temp 97.5°F | Ht 63.75 in | Wt 154.0 lb

## 2013-11-08 DIAGNOSIS — Z23 Encounter for immunization: Secondary | ICD-10-CM

## 2013-11-08 DIAGNOSIS — Z0000 Encounter for general adult medical examination without abnormal findings: Secondary | ICD-10-CM

## 2013-11-08 NOTE — Progress Notes (Signed)
   Subjective:    Patient ID: Abigail Hayes, female    DOB: 05-14-1961, 53 y.o.   MRN: [id number]  HPI Patient here for complete physical. She sees gynecologist regularly. She had recent Pap smear and mammogram reportedly normal. Last tetanus was 10 years ago. She has psoriatic arthritis followed by rheumatologist. His had some recent issues with shoulder and hand arthritis. This is interfering some with sleep. She gets regular followup with dermatology. She is currently not taking any immunosuppressants. She exercises fairly regularly. Otherwise feels well. Nonsmoker. Colonoscopy up to date  Past Medical History  Diagnosis Date  . PSORIATIC ARTHRITIS 07/17/2009  . Enlargement of lymph nodes 07/17/2009  . Chicken pox    Past Surgical History  Procedure Laterality Date  . Anterior cruciate ligament repair  2006    reports that she has never smoked. She has never used smokeless tobacco. She reports that she drinks alcohol. She reports that she does not use illicit drugs. family history includes Cancer in her father and other; Cancer (age of onset: 47) in her mother; Heart disease in her other; Hyperlipidemia in her other; Hypertension in her other. No Known Allergies    Review of Systems  Constitutional: Negative for fever, activity change, appetite change, fatigue and unexpected weight change.  HENT: Negative for ear pain, hearing loss, sore throat and trouble swallowing.   Eyes: Negative for visual disturbance.  Respiratory: Negative for cough and shortness of breath.   Cardiovascular: Negative for chest pain and palpitations.  Gastrointestinal: Negative for abdominal pain, diarrhea, constipation and blood in stool.  Genitourinary: Negative for dysuria and hematuria.  Musculoskeletal: Positive for arthralgias. Negative for back pain and myalgias.  Skin: Positive for rash (Psoriasis rash which is chronic).  Neurological: Negative for dizziness, syncope and headaches.  Hematological:  Negative for adenopathy.  Psychiatric/Behavioral: Negative for confusion and dysphoric mood.       Objective:   Physical Exam  Constitutional: She is oriented to person, place, and time. She appears well-developed and well-nourished.  HENT:  Head: Normocephalic and atraumatic.  Eyes: EOM are normal. Pupils are equal, round, and reactive to light.  Neck: Normal range of motion. Neck supple. No thyromegaly present.  Cardiovascular: Normal rate, regular rhythm and normal heart sounds.   No murmur heard. Pulmonary/Chest: Breath sounds normal. No respiratory distress. She has no wheezes. She has no rales.  Abdominal: Soft. Bowel sounds are normal. She exhibits no distension and no mass. There is no tenderness. There is no rebound and no guarding.  Genitourinary:  Per GYN  Musculoskeletal: Normal range of motion. She exhibits no edema.  Lymphadenopathy:    She has no cervical adenopathy.  Neurological: She is alert and oriented to person, place, and time. She displays normal reflexes. No cranial nerve deficit.  Skin: No rash noted.  Psychiatric: She has a normal mood and affect. Her behavior is normal. Judgment and thought content normal.          Assessment & Plan:  Complete physical. She will continue GYN followup. Labs reviewed with no major concerns. Tetanus booster given. Colonoscopy up to date. We discussed the importance of weight bearing exercise and adequate calcium and vitamin D intake.

## 2013-11-08 NOTE — Progress Notes (Signed)
Pre visit review using our clinic review tool, if applicable. No additional management support is needed unless otherwise documented below in the visit note. 

## 2015-05-16 ENCOUNTER — Encounter: Payer: Self-pay | Admitting: Family Medicine

## 2015-05-16 ENCOUNTER — Ambulatory Visit (INDEPENDENT_AMBULATORY_CARE_PROVIDER_SITE_OTHER): Payer: BLUE CROSS/BLUE SHIELD | Admitting: Family Medicine

## 2015-05-16 VITALS — BP 128/80 | HR 72 | Temp 97.9°F | Wt 151.0 lb

## 2015-05-16 DIAGNOSIS — T700XXA Otitic barotrauma, initial encounter: Secondary | ICD-10-CM | POA: Diagnosis not present

## 2015-05-16 DIAGNOSIS — M25561 Pain in right knee: Secondary | ICD-10-CM

## 2015-05-16 NOTE — Progress Notes (Signed)
   Subjective:    Patient ID: Abigail Hayes, female    DOB: 01-Apr-1961, 54 y.o.   MRN: [id number]  HPI Patient seen for the following issues  Bilateral ear congestion right greater than left. She flew to [place] about 5 weeks ago and has had some congestive symptoms since then. She has occasional ringing in the right ear. No vertigo. No ear drainage. No fevers or chills. She has some mild nasal congestion especially early mornings. No purulence secretions. No headache. She took some decongestant around the time of her flight but not since then.   Second issue is patient requesting "second opinion" regarding her right knee. She has a snapping sensation when she flexes and extends her leg. She's had previous anterior cruciate ligament surgery years ago by Dr. Veverly Fells with [facility] orthopedics. Patient states that he was uncertain what was causing the snapping and she would like to have this assessed by someone else. She has minimal pain and this is more of a nuisance.  She has history of psoriatic arthritis and currently in the process of looking for a new rheumatologist. She has taken methotrexate in the past.  Past Medical History  Diagnosis Date  . PSORIATIC ARTHRITIS 07/17/2009  . Enlargement of lymph nodes 07/17/2009  . Chicken pox    Past Surgical History  Procedure Laterality Date  . Anterior cruciate ligament repair  2006    reports that she has never smoked. She has never used smokeless tobacco. She reports that she drinks alcohol. She reports that she does not use illicit drugs. family history includes Cancer in her father and other; Cancer (age of onset: 33) in her mother; Heart disease in her other; Hyperlipidemia in her other; Hypertension in her other. No Known Allergies    Review of Systems  Constitutional: Negative for fever and chills.  HENT: Positive for congestion. Negative for ear pain, postnasal drip and sinus pressure.   Respiratory: Negative for cough.     Cardiovascular: Negative for chest pain.  Genitourinary: Negative for dysuria.  Neurological: Negative for dizziness, weakness and headaches.       Objective:   Physical Exam  Constitutional: She appears well-developed and well-nourished.  HENT:  Right Ear: External ear normal.  Left Ear: External ear normal.  Nose: Nose normal.  Mouth/Throat: Oropharynx is clear and moist.  Neck: Neck supple. No thyromegaly present.  Cardiovascular: Normal rate and regular rhythm.   Pulmonary/Chest: Effort normal and breath sounds normal. No respiratory distress. She has no wheezes. She has no rales.  Musculoskeletal: She exhibits no edema.  Right knee no effusion. No localized tenderness. Just lateral to her patella she has snapping sensation when flexing and extending the knee          Assessment & Plan:  #1 probable eustachian tube dysfunction. Ear exam is normal-no signs of infection or obvious effusion.  Recommend Flonase or Nasacort daily for the next couple weeks. Touch base in 2 weeks if not improving. #2 right knee pain. Mostly, she is describing a snapping sensation when flexing and extending the knee. ?tendonous. Set up sports medicine referral for further evaluation

## 2015-05-16 NOTE — Progress Notes (Signed)
Pre visit review using our clinic review tool, if applicable. No additional management support is needed unless otherwise documented below in the visit note. 

## 2015-05-16 NOTE — Patient Instructions (Addendum)
Barotitis Media Barotitis media is inflammation of your middle ear. This occurs when the auditory tube (eustachian tube) leading from the back of your nose (nasopharynx) to your eardrum is blocked. This blockage Rembold result from a cold, environmental allergies, or an upper respiratory infection. Unresolved barotitis media Sangha lead to damage or hearing loss (barotrauma), which Sabedra become permanent. HOME CARE INSTRUCTIONS   Use medicines as recommended by your health care provider. Over-the-counter medicines will help unblock the canal and can help during times of air travel.  Do not put anything into your ears to clean or unplug them. Eardrops will not be helpful.  Do not swim, dive, or fly until your health care provider says it is all right to do so. If these activities are necessary, chewing gum with frequent, forceful swallowing Arias help. It is also helpful to hold your nose and gently blow to pop your ears for equalizing pressure changes. This forces air into the eustachian tube.  Only take over-the-counter or prescription medicines for pain, discomfort, or fever as directed by your health care provider.  A decongestant Scaturro be helpful in decongesting the middle ear and make pressure equalization easier. SEEK MEDICAL CARE IF:  You experience a serious form of dizziness in which you feel as if the room is spinning and you feel nauseated (vertigo).  Your symptoms only involve one ear. SEEK IMMEDIATE MEDICAL CARE IF:   You develop a severe headache, dizziness, or severe ear pain.  You have bloody or pus-like drainage from your ears.  You develop a fever.  Your problems do not improve or become worse. MAKE SURE YOU:   Understand these instructions.  Will watch your condition.  Will get help right away if you are not doing well or get worse. Document Released: 08/28/2000 Document Revised: 06/21/2013 Document Reviewed: 03/28/2013 [facility] Patient Information 2015 [place], [place]. This  information is not intended to replace advice given to you by your health care provider. Make sure you discuss any questions you have with your health care provider.   Try over the counter Flonase or Nasacort for ear congestion symptoms and be in touch in 2 weeks if no better.

## 2015-07-02 ENCOUNTER — Ambulatory Visit (INDEPENDENT_AMBULATORY_CARE_PROVIDER_SITE_OTHER): Payer: BLUE CROSS/BLUE SHIELD | Admitting: Sports Medicine

## 2015-07-02 ENCOUNTER — Encounter: Payer: Self-pay | Admitting: Sports Medicine

## 2015-07-02 VITALS — BP 134/87 | Ht 64.0 in | Wt 148.0 lb

## 2015-07-02 DIAGNOSIS — M222X1 Patellofemoral disorders, right knee: Secondary | ICD-10-CM

## 2015-07-02 DIAGNOSIS — M222X9 Patellofemoral disorders, unspecified knee: Secondary | ICD-10-CM | POA: Insufficient documentation

## 2015-07-02 DIAGNOSIS — L4050 Arthropathic psoriasis, unspecified: Secondary | ICD-10-CM

## 2015-07-02 NOTE — Assessment & Plan Note (Signed)
This is currently under [place] and improving  I do not think this is a primary cause of her knee pain  Minimal response of keee pain to prednisone in recent past

## 2015-07-02 NOTE — Progress Notes (Signed)
Patient ID: Abigail Hayes, female   DOB: 05/20/1961, 54 y.o.   MRN: [id number]  Hx of psoriasis Started in 2000 Mult joints involved Control with MTX at low dose  About a year or so job ended and she started doing lots of exercise Now has had a flare Back to Dr Amil Amen and now on MTX again at 15 mg/wk  Rt knee has swollen and worse with running and activity  Now with 3 wks of MTX inflammation is better She also added some strength exercises for knee and that Lavin have helped  She was evaluated by Dr Veverly Fells who did ACL surgery on RT knee 13 yrs He did MRI No meniscus injury/ small bakers cyst Stable ACL graft No DJD  ROS no fevers now/ swelling in both wrists, swelling in small joints of hands, no skin lesions, no night sweats  PEXAM NAD, looks physically fit BP 134/87 mmHg  Ht 5\' 4"  (1.626 m)  Wt 148 lb (67.132 kg)  BMI 25.39 kg/m2  LMP 06/03/2011  Knee: Normal to inspection with no erythema or effusion or obvious bony abnormalities. There is a surgical scar Palpation normal with no warmth or joint line tenderness or patellar tenderness or condyle tenderness. She has crepitaiton with patellar motion more on RT than LT Tracking shows excess motion  ROM normal in flexion and extension and lower leg rotation. Ligaments with solid consistent endpoints including ACL, PCL, LCL, MCL. Negative Mcmurray's and provocative meniscal tests. Non painful patellar compression. Patellar and quadriceps tendons unremarkable. Hamstring and quadriceps strength is normal.  Hip abductor is weak on RT and strong on left Hip flexor is strong

## 2015-07-02 NOTE — Assessment & Plan Note (Signed)
While she has done very well P ACL surgery 12 yrs ago there is still some strength imbalance with RT weaker  Given a series of exercise to stabilize hip and patellar tracking  Use open patellar sleeve when walking standing  XTrain on bike and swimming under psor. Arthritis in good control  Will follow with DR Burchette but I will be happy to reassess if not improving

## 2015-07-02 NOTE — Patient Instructions (Signed)
Good news Even after psoriasis and ACL tear you have minimal arthritis in RT knee No sign of meniscus tear MRI confirms this  Your symptoms are more from your patello femoral joint (seen on MRI)  We nee better abductor strength of RT hip  Avoid running and much weight exercise until good control of psoriatic arhtritis  Use knee compression sleeve with open patella  Biking would be excellent rehab - start at 15 mins and build 5 mins each week Keep resistance as relatively easy  Series of home hip exercises

## 2015-07-03 ENCOUNTER — Encounter: Payer: Self-pay | Admitting: Sports Medicine

## 2015-07-30 ENCOUNTER — Telehealth: Payer: Self-pay | Admitting: Family Medicine

## 2015-07-30 DIAGNOSIS — H6980 Other specified disorders of Eustachian tube, unspecified ear: Secondary | ICD-10-CM

## 2015-07-30 NOTE — Telephone Encounter (Signed)
Pt said she still having ear issues. She call to ask if she need an appt or will you be referring her to a ENT

## 2015-07-31 NOTE — Telephone Encounter (Signed)
Pt was seen on 05/16/2015 for this symptom. Okay to refer?

## 2015-07-31 NOTE — Telephone Encounter (Signed)
Order entered

## 2015-07-31 NOTE — Telephone Encounter (Signed)
ENT referral if no better.

## 2015-12-17 ENCOUNTER — Telehealth: Payer: Self-pay | Admitting: Family Medicine

## 2015-12-17 NOTE — Telephone Encounter (Signed)
Patient Name: Abigail Hayes  DOB: [birth year]    Initial Comment Caller states took AZO test; states has UTI; wants meds called in w/o seeing MD.    Nurse Assessment  Nurse: Julien Girt, RN, Almyra Free Date/Time (Eastern Time): 12/17/2015 12:59:45 PM  Confirm and document reason for call. If symptomatic, describe symptoms. You must click the next button to save text entered. ---Caller states she took an AZO test, states it was positive for leukocytes, negative for nitrates and she would like abx called in without seeing her doctor. These sx have been present x1 week, she has been in [place]. She has painful urination and mild frequency Denies flank pain, hematuria or fever. She declines triage of her sx.  Has the patient traveled out of the country within the last 30 days? ---Not Applicable  Does the patient have any new or worsening symptoms? ---Yes  Will a triage be completed? ---No  Select reason for no triage. ---Patient declined  Please document clinical information provided and list any resource used. ---NKDA, Currently taking Methotrexate for Psoriatic Arthritis, Uses [facility]on file. Asiya Muscatello that I would send this note to the on call practitioner and she can anticipate a call back. She verbalized understanding and will cb as needed.

## 2015-12-18 NOTE — Telephone Encounter (Signed)
fyi

## 2015-12-18 NOTE — Telephone Encounter (Signed)
Pt will need to be seen if she wants to be treated for UTI. Almyra Free is here today seeing patient if Dr. Elease Hashimoto has no openings.

## 2015-12-18 NOTE — Telephone Encounter (Signed)
Pt decline an appt

## 2016-02-24 ENCOUNTER — Other Ambulatory Visit: Payer: Self-pay | Admitting: Obstetrics and Gynecology

## 2016-02-24 ENCOUNTER — Other Ambulatory Visit (HOSPITAL_BASED_OUTPATIENT_CLINIC_OR_DEPARTMENT_OTHER): Payer: Self-pay | Admitting: Obstetrics and Gynecology

## 2016-02-24 DIAGNOSIS — Z1231 Encounter for screening mammogram for malignant neoplasm of breast: Secondary | ICD-10-CM

## 2016-03-03 ENCOUNTER — Ambulatory Visit
Admission: RE | Admit: 2016-03-03 | Discharge: 2016-03-03 | Disposition: A | Discharge status: Home / Self Care | Payer: BLUE CROSS/BLUE SHIELD | Source: Ambulatory Visit | Attending: Obstetrics and Gynecology | Admitting: Obstetrics and Gynecology

## 2016-03-03 DIAGNOSIS — Z1231 Encounter for screening mammogram for malignant neoplasm of breast: Secondary | ICD-10-CM

## 2016-06-03 DIAGNOSIS — J301 Allergic rhinitis due to pollen: Secondary | ICD-10-CM | POA: Diagnosis not present

## 2016-06-03 DIAGNOSIS — J3081 Allergic rhinitis due to animal (cat) (dog) hair and dander: Secondary | ICD-10-CM | POA: Diagnosis not present

## 2016-06-03 DIAGNOSIS — J3089 Other allergic rhinitis: Secondary | ICD-10-CM | POA: Diagnosis not present

## 2016-06-08 DIAGNOSIS — J301 Allergic rhinitis due to pollen: Secondary | ICD-10-CM | POA: Diagnosis not present

## 2016-06-08 DIAGNOSIS — J3081 Allergic rhinitis due to animal (cat) (dog) hair and dander: Secondary | ICD-10-CM | POA: Diagnosis not present

## 2016-06-09 DIAGNOSIS — J3089 Other allergic rhinitis: Secondary | ICD-10-CM | POA: Diagnosis not present

## 2016-06-19 DIAGNOSIS — J3089 Other allergic rhinitis: Secondary | ICD-10-CM | POA: Diagnosis not present

## 2016-06-19 DIAGNOSIS — A770 Spotted fever due to Rickettsia rickettsii: Secondary | ICD-10-CM | POA: Diagnosis not present

## 2016-06-19 DIAGNOSIS — M8580 Other specified disorders of bone density and structure, unspecified site: Secondary | ICD-10-CM | POA: Diagnosis not present

## 2016-06-19 DIAGNOSIS — L409 Psoriasis, unspecified: Secondary | ICD-10-CM | POA: Diagnosis not present

## 2016-06-19 DIAGNOSIS — L4059 Other psoriatic arthropathy: Secondary | ICD-10-CM | POA: Diagnosis not present

## 2016-06-19 DIAGNOSIS — J3081 Allergic rhinitis due to animal (cat) (dog) hair and dander: Secondary | ICD-10-CM | POA: Diagnosis not present

## 2016-06-19 DIAGNOSIS — J301 Allergic rhinitis due to pollen: Secondary | ICD-10-CM | POA: Diagnosis not present

## 2016-07-02 DIAGNOSIS — J3081 Allergic rhinitis due to animal (cat) (dog) hair and dander: Secondary | ICD-10-CM | POA: Diagnosis not present

## 2016-07-02 DIAGNOSIS — J301 Allergic rhinitis due to pollen: Secondary | ICD-10-CM | POA: Diagnosis not present

## 2016-07-02 DIAGNOSIS — J3089 Other allergic rhinitis: Secondary | ICD-10-CM | POA: Diagnosis not present

## 2016-07-21 DIAGNOSIS — J3089 Other allergic rhinitis: Secondary | ICD-10-CM | POA: Diagnosis not present

## 2016-07-21 DIAGNOSIS — J3081 Allergic rhinitis due to animal (cat) (dog) hair and dander: Secondary | ICD-10-CM | POA: Diagnosis not present

## 2016-07-21 DIAGNOSIS — J301 Allergic rhinitis due to pollen: Secondary | ICD-10-CM | POA: Diagnosis not present

## 2016-07-24 DIAGNOSIS — J3089 Other allergic rhinitis: Secondary | ICD-10-CM | POA: Diagnosis not present

## 2016-07-24 DIAGNOSIS — J301 Allergic rhinitis due to pollen: Secondary | ICD-10-CM | POA: Diagnosis not present

## 2016-07-24 DIAGNOSIS — J3081 Allergic rhinitis due to animal (cat) (dog) hair and dander: Secondary | ICD-10-CM | POA: Diagnosis not present

## 2016-07-29 DIAGNOSIS — J3081 Allergic rhinitis due to animal (cat) (dog) hair and dander: Secondary | ICD-10-CM | POA: Diagnosis not present

## 2016-07-29 DIAGNOSIS — J301 Allergic rhinitis due to pollen: Secondary | ICD-10-CM | POA: Diagnosis not present

## 2016-07-29 DIAGNOSIS — J3089 Other allergic rhinitis: Secondary | ICD-10-CM | POA: Diagnosis not present

## 2016-07-31 DIAGNOSIS — J3089 Other allergic rhinitis: Secondary | ICD-10-CM | POA: Diagnosis not present

## 2016-07-31 DIAGNOSIS — J301 Allergic rhinitis due to pollen: Secondary | ICD-10-CM | POA: Diagnosis not present

## 2016-07-31 DIAGNOSIS — J3081 Allergic rhinitis due to animal (cat) (dog) hair and dander: Secondary | ICD-10-CM | POA: Diagnosis not present

## 2016-08-03 DIAGNOSIS — J3081 Allergic rhinitis due to animal (cat) (dog) hair and dander: Secondary | ICD-10-CM | POA: Diagnosis not present

## 2016-08-03 DIAGNOSIS — M8580 Other specified disorders of bone density and structure, unspecified site: Secondary | ICD-10-CM | POA: Diagnosis not present

## 2016-08-03 DIAGNOSIS — J3089 Other allergic rhinitis: Secondary | ICD-10-CM | POA: Diagnosis not present

## 2016-08-03 DIAGNOSIS — L409 Psoriasis, unspecified: Secondary | ICD-10-CM | POA: Diagnosis not present

## 2016-08-03 DIAGNOSIS — A770 Spotted fever due to Rickettsia rickettsii: Secondary | ICD-10-CM | POA: Diagnosis not present

## 2016-08-03 DIAGNOSIS — J301 Allergic rhinitis due to pollen: Secondary | ICD-10-CM | POA: Diagnosis not present

## 2016-08-03 DIAGNOSIS — L4059 Other psoriatic arthropathy: Secondary | ICD-10-CM | POA: Diagnosis not present

## 2016-08-05 DIAGNOSIS — J301 Allergic rhinitis due to pollen: Secondary | ICD-10-CM | POA: Diagnosis not present

## 2016-08-05 DIAGNOSIS — J3081 Allergic rhinitis due to animal (cat) (dog) hair and dander: Secondary | ICD-10-CM | POA: Diagnosis not present

## 2016-08-05 DIAGNOSIS — J3089 Other allergic rhinitis: Secondary | ICD-10-CM | POA: Diagnosis not present

## 2016-08-17 DIAGNOSIS — J3089 Other allergic rhinitis: Secondary | ICD-10-CM | POA: Diagnosis not present

## 2016-08-17 DIAGNOSIS — J301 Allergic rhinitis due to pollen: Secondary | ICD-10-CM | POA: Diagnosis not present

## 2016-08-17 DIAGNOSIS — J3081 Allergic rhinitis due to animal (cat) (dog) hair and dander: Secondary | ICD-10-CM | POA: Diagnosis not present

## 2016-08-24 DIAGNOSIS — J3081 Allergic rhinitis due to animal (cat) (dog) hair and dander: Secondary | ICD-10-CM | POA: Diagnosis not present

## 2016-08-24 DIAGNOSIS — J3089 Other allergic rhinitis: Secondary | ICD-10-CM | POA: Diagnosis not present

## 2016-08-24 DIAGNOSIS — J301 Allergic rhinitis due to pollen: Secondary | ICD-10-CM | POA: Diagnosis not present

## 2016-09-03 DIAGNOSIS — J3089 Other allergic rhinitis: Secondary | ICD-10-CM | POA: Diagnosis not present

## 2016-09-03 DIAGNOSIS — J301 Allergic rhinitis due to pollen: Secondary | ICD-10-CM | POA: Diagnosis not present

## 2016-09-03 DIAGNOSIS — J3081 Allergic rhinitis due to animal (cat) (dog) hair and dander: Secondary | ICD-10-CM | POA: Diagnosis not present

## 2016-09-25 DIAGNOSIS — J3081 Allergic rhinitis due to animal (cat) (dog) hair and dander: Secondary | ICD-10-CM | POA: Diagnosis not present

## 2016-09-25 DIAGNOSIS — J301 Allergic rhinitis due to pollen: Secondary | ICD-10-CM | POA: Diagnosis not present

## 2016-09-25 DIAGNOSIS — J3089 Other allergic rhinitis: Secondary | ICD-10-CM | POA: Diagnosis not present

## 2016-10-16 DIAGNOSIS — J301 Allergic rhinitis due to pollen: Secondary | ICD-10-CM | POA: Diagnosis not present

## 2016-10-16 DIAGNOSIS — J3081 Allergic rhinitis due to animal (cat) (dog) hair and dander: Secondary | ICD-10-CM | POA: Diagnosis not present

## 2016-10-16 DIAGNOSIS — J3089 Other allergic rhinitis: Secondary | ICD-10-CM | POA: Diagnosis not present

## 2016-10-30 ENCOUNTER — Ambulatory Visit: Payer: BLUE CROSS/BLUE SHIELD | Admitting: Family Medicine

## 2016-11-05 DIAGNOSIS — J3081 Allergic rhinitis due to animal (cat) (dog) hair and dander: Secondary | ICD-10-CM | POA: Diagnosis not present

## 2016-11-05 DIAGNOSIS — L409 Psoriasis, unspecified: Secondary | ICD-10-CM | POA: Diagnosis not present

## 2016-11-05 DIAGNOSIS — J301 Allergic rhinitis due to pollen: Secondary | ICD-10-CM | POA: Diagnosis not present

## 2016-11-05 DIAGNOSIS — L4059 Other psoriatic arthropathy: Secondary | ICD-10-CM | POA: Diagnosis not present

## 2016-11-05 DIAGNOSIS — S99911A Unspecified injury of right ankle, initial encounter: Secondary | ICD-10-CM | POA: Diagnosis not present

## 2016-11-05 DIAGNOSIS — J3089 Other allergic rhinitis: Secondary | ICD-10-CM | POA: Diagnosis not present

## 2016-11-05 DIAGNOSIS — M8580 Other specified disorders of bone density and structure, unspecified site: Secondary | ICD-10-CM | POA: Diagnosis not present

## 2016-11-19 DIAGNOSIS — J3081 Allergic rhinitis due to animal (cat) (dog) hair and dander: Secondary | ICD-10-CM | POA: Diagnosis not present

## 2016-11-19 DIAGNOSIS — J3089 Other allergic rhinitis: Secondary | ICD-10-CM | POA: Diagnosis not present

## 2016-11-19 DIAGNOSIS — J301 Allergic rhinitis due to pollen: Secondary | ICD-10-CM | POA: Diagnosis not present

## 2016-12-10 DIAGNOSIS — J301 Allergic rhinitis due to pollen: Secondary | ICD-10-CM | POA: Diagnosis not present

## 2016-12-10 DIAGNOSIS — J3081 Allergic rhinitis due to animal (cat) (dog) hair and dander: Secondary | ICD-10-CM | POA: Diagnosis not present

## 2016-12-10 DIAGNOSIS — J3089 Other allergic rhinitis: Secondary | ICD-10-CM | POA: Diagnosis not present

## 2016-12-23 DIAGNOSIS — J3089 Other allergic rhinitis: Secondary | ICD-10-CM | POA: Diagnosis not present

## 2016-12-23 DIAGNOSIS — J301 Allergic rhinitis due to pollen: Secondary | ICD-10-CM | POA: Diagnosis not present

## 2016-12-23 DIAGNOSIS — J3081 Allergic rhinitis due to animal (cat) (dog) hair and dander: Secondary | ICD-10-CM | POA: Diagnosis not present

## 2017-01-04 ENCOUNTER — Encounter: Payer: Self-pay | Admitting: Family Medicine

## 2017-01-04 ENCOUNTER — Ambulatory Visit (INDEPENDENT_AMBULATORY_CARE_PROVIDER_SITE_OTHER): Payer: BLUE CROSS/BLUE SHIELD | Admitting: Family Medicine

## 2017-01-04 VITALS — BP 102/70 | HR 87 | Temp 98.0°F | Wt 154.8 lb

## 2017-01-04 DIAGNOSIS — R1013 Epigastric pain: Secondary | ICD-10-CM | POA: Diagnosis not present

## 2017-01-04 NOTE — Progress Notes (Signed)
Pre visit review using our clinic review tool, if applicable. No additional management support is needed unless otherwise documented below in the visit note. 

## 2017-01-04 NOTE — Patient Instructions (Signed)
We will set up UGI to look at upper GI tract.

## 2017-01-04 NOTE — Progress Notes (Signed)
Subjective:     Patient ID: Abigail Hayes, female   DOB: [birth year], 56 y.o.   MRN: [id number]  HPI Patient has history of psoriatic arthritis and is seen today with some abdominal pain she states she's had since prior to Christmas of last year. She has pain mid upper abdominal area which radiates inferior. Pain is relatively mild. She feels that her pain is worse after eating. Occasionally improves with bowel movement but she denies any constipation or diarrhea. No fevers or chills. No nausea or vomiting. No melena. She frequently feels bloated after eating. No postprandial nausea. Appetite is good. No recent weight loss. Denies any right upper quadrant pain  She has psoriatic arthritis and was on Enbrel and this quit working was taken off in December. She is currently taking only methotrexate. She is looking at another possible immunosuppressant drug soon. This has limited her exercise because of her recent flareup with arthritis.  She had recent CBC and CMP which were normal.  Past Medical History:  Diagnosis Date  . Chicken pox   . Enlargement of lymph nodes 07/17/2009  . PSORIATIC ARTHRITIS 07/17/2009   Past Surgical History:  Procedure Laterality Date  . ANTERIOR CRUCIATE LIGAMENT REPAIR  2006    reports that she has never smoked. She has never used smokeless tobacco. She reports that she drinks alcohol. She reports that she does not use drugs. family history includes Cancer in her father and other; Cancer (age of onset: 90) in her mother; Heart disease in her other; Hyperlipidemia in her other; Hypertension in her other. No Known Allergies   Review of Systems  Constitutional: Negative for appetite change, chills, fever and unexpected weight change.  HENT: Negative for trouble swallowing.   Cardiovascular: Negative for chest pain.  Gastrointestinal: Positive for abdominal pain. Negative for blood in stool, nausea and vomiting.       Objective:   Physical Exam  Cardiovascular: Normal  rate and regular rhythm.   Pulmonary/Chest: Effort normal and breath sounds normal. No respiratory distress. She has no wheezes.  Abdominal: Soft. Bowel sounds are normal. She exhibits no distension and no mass. There is no tenderness. There is no rebound and no guarding.       Assessment:     Patient presents with several month history of epigastric pains radiating inferiorly. She has no red flags such as appetite or weight change. She does not have any right upper quadrant pain or other symptoms to suggest likely symptomatic gallstones.    Plan:     -We elected not to get any labs since she has regular labs through rheumatology with CBC and comprehensive metabolic panel recently -Set up upper GI to further evaluate -consider GI referral if above unrevealing and symptoms persist.  Eulas Post MD [facility] at [facility]

## 2017-01-12 DIAGNOSIS — J301 Allergic rhinitis due to pollen: Secondary | ICD-10-CM | POA: Diagnosis not present

## 2017-01-12 DIAGNOSIS — J3081 Allergic rhinitis due to animal (cat) (dog) hair and dander: Secondary | ICD-10-CM | POA: Diagnosis not present

## 2017-01-12 DIAGNOSIS — J3089 Other allergic rhinitis: Secondary | ICD-10-CM | POA: Diagnosis not present

## 2017-01-26 DIAGNOSIS — J3089 Other allergic rhinitis: Secondary | ICD-10-CM | POA: Diagnosis not present

## 2017-01-26 DIAGNOSIS — J301 Allergic rhinitis due to pollen: Secondary | ICD-10-CM | POA: Diagnosis not present

## 2017-01-26 DIAGNOSIS — J3081 Allergic rhinitis due to animal (cat) (dog) hair and dander: Secondary | ICD-10-CM | POA: Diagnosis not present

## 2017-01-26 DIAGNOSIS — N9089 Other specified noninflammatory disorders of vulva and perineum: Secondary | ICD-10-CM | POA: Diagnosis not present

## 2017-02-01 ENCOUNTER — Other Ambulatory Visit: Payer: Self-pay | Admitting: Obstetrics and Gynecology

## 2017-02-01 DIAGNOSIS — Z1231 Encounter for screening mammogram for malignant neoplasm of breast: Secondary | ICD-10-CM

## 2017-02-10 ENCOUNTER — Telehealth: Payer: Self-pay | Admitting: Family Medicine

## 2017-02-10 NOTE — Telephone Encounter (Signed)
Pt would like to have a referral to a GI that she stated that was requested back in April.  Can we get a referral for pt?

## 2017-02-11 NOTE — Telephone Encounter (Signed)
Abigail Hayes is scheduling now.

## 2017-02-11 NOTE — Telephone Encounter (Signed)
It looks like Dr. Elease Hashimoto ordered a DG UGI to be done. I am not sure if this has to be scheduled or not since it often is used with barium.   Can you ask Deb if this needs to be scheduled?

## 2017-02-16 DIAGNOSIS — J301 Allergic rhinitis due to pollen: Secondary | ICD-10-CM | POA: Diagnosis not present

## 2017-02-16 DIAGNOSIS — J3089 Other allergic rhinitis: Secondary | ICD-10-CM | POA: Diagnosis not present

## 2017-02-16 DIAGNOSIS — J3081 Allergic rhinitis due to animal (cat) (dog) hair and dander: Secondary | ICD-10-CM | POA: Diagnosis not present

## 2017-03-03 ENCOUNTER — Other Ambulatory Visit: Payer: Self-pay | Admitting: Family Medicine

## 2017-03-03 NOTE — Telephone Encounter (Signed)
Received a fax from the pharmacy.  Pt is requesting 7 tabs of Ambien.

## 2017-03-04 ENCOUNTER — Telehealth: Payer: Self-pay | Admitting: Family Medicine

## 2017-03-04 ENCOUNTER — Ambulatory Visit
Admission: RE | Admit: 2017-03-04 | Discharge: 2017-03-04 | Disposition: A | Discharge status: Home / Self Care | Payer: BLUE CROSS/BLUE SHIELD | Source: Ambulatory Visit | Attending: Obstetrics and Gynecology | Admitting: Obstetrics and Gynecology

## 2017-03-04 DIAGNOSIS — Z1231 Encounter for screening mammogram for malignant neoplasm of breast: Secondary | ICD-10-CM | POA: Diagnosis not present

## 2017-03-04 NOTE — Telephone Encounter (Signed)
Pt needs new rx  7 ambien pill  [facility]. Pt is getting ready to travel internationally

## 2017-03-05 ENCOUNTER — Ambulatory Visit (HOSPITAL_COMMUNITY)
Admission: RE | Admit: 2017-03-05 | Discharge: 2017-03-05 | Disposition: A | Discharge status: Home / Self Care | Payer: BLUE CROSS/BLUE SHIELD | Source: Ambulatory Visit | Attending: Family Medicine | Admitting: Family Medicine

## 2017-03-05 DIAGNOSIS — R1013 Epigastric pain: Secondary | ICD-10-CM | POA: Insufficient documentation

## 2017-03-05 MED ORDER — ZOLPIDEM TARTRATE 5 MG PO TABS
5.0000 mg | ORAL_TABLET | Freq: Every evening | ORAL | 0 refills | Status: DC | PRN
Start: 2017-03-05 — End: 2022-10-07

## 2017-03-05 NOTE — Telephone Encounter (Signed)
Rx called in 

## 2017-03-05 NOTE — Telephone Encounter (Signed)
Left message on machine for patient to return our call 

## 2017-03-05 NOTE — Telephone Encounter (Signed)
Last office visit 01/04/17.  Okay to fill?

## 2017-03-05 NOTE — Telephone Encounter (Signed)
ok 

## 2017-03-09 DIAGNOSIS — J3089 Other allergic rhinitis: Secondary | ICD-10-CM | POA: Diagnosis not present

## 2017-03-09 DIAGNOSIS — J3081 Allergic rhinitis due to animal (cat) (dog) hair and dander: Secondary | ICD-10-CM | POA: Diagnosis not present

## 2017-03-09 DIAGNOSIS — J301 Allergic rhinitis due to pollen: Secondary | ICD-10-CM | POA: Diagnosis not present

## 2017-04-23 DIAGNOSIS — J301 Allergic rhinitis due to pollen: Secondary | ICD-10-CM | POA: Diagnosis not present

## 2017-04-23 DIAGNOSIS — J3081 Allergic rhinitis due to animal (cat) (dog) hair and dander: Secondary | ICD-10-CM | POA: Diagnosis not present

## 2017-04-23 DIAGNOSIS — J3089 Other allergic rhinitis: Secondary | ICD-10-CM | POA: Diagnosis not present

## 2017-04-27 DIAGNOSIS — L4050 Arthropathic psoriasis, unspecified: Secondary | ICD-10-CM | POA: Diagnosis not present

## 2017-04-27 DIAGNOSIS — Z6826 Body mass index (BMI) 26.0-26.9, adult: Secondary | ICD-10-CM | POA: Diagnosis not present

## 2017-04-27 DIAGNOSIS — R350 Frequency of micturition: Secondary | ICD-10-CM | POA: Diagnosis not present

## 2017-04-27 DIAGNOSIS — Z113 Encounter for screening for infections with a predominantly sexual mode of transmission: Secondary | ICD-10-CM | POA: Diagnosis not present

## 2017-04-27 DIAGNOSIS — S335XXA Sprain of ligaments of lumbar spine, initial encounter: Secondary | ICD-10-CM | POA: Diagnosis not present

## 2017-04-27 DIAGNOSIS — Z01419 Encounter for gynecological examination (general) (routine) without abnormal findings: Secondary | ICD-10-CM | POA: Diagnosis not present

## 2017-04-27 DIAGNOSIS — M545 Low back pain: Secondary | ICD-10-CM | POA: Diagnosis not present

## 2017-04-27 DIAGNOSIS — N760 Acute vaginitis: Secondary | ICD-10-CM | POA: Diagnosis not present

## 2017-05-03 DIAGNOSIS — R21 Rash and other nonspecific skin eruption: Secondary | ICD-10-CM | POA: Diagnosis not present

## 2017-05-03 DIAGNOSIS — J301 Allergic rhinitis due to pollen: Secondary | ICD-10-CM | POA: Diagnosis not present

## 2017-05-03 DIAGNOSIS — J3089 Other allergic rhinitis: Secondary | ICD-10-CM | POA: Diagnosis not present

## 2017-05-03 DIAGNOSIS — J3081 Allergic rhinitis due to animal (cat) (dog) hair and dander: Secondary | ICD-10-CM | POA: Diagnosis not present

## 2017-05-03 DIAGNOSIS — R062 Wheezing: Secondary | ICD-10-CM | POA: Diagnosis not present

## 2017-05-06 DIAGNOSIS — J3081 Allergic rhinitis due to animal (cat) (dog) hair and dander: Secondary | ICD-10-CM | POA: Diagnosis not present

## 2017-05-06 DIAGNOSIS — J301 Allergic rhinitis due to pollen: Secondary | ICD-10-CM | POA: Diagnosis not present

## 2017-05-07 DIAGNOSIS — J3089 Other allergic rhinitis: Secondary | ICD-10-CM | POA: Diagnosis not present

## 2017-05-18 DIAGNOSIS — J301 Allergic rhinitis due to pollen: Secondary | ICD-10-CM | POA: Diagnosis not present

## 2017-05-18 DIAGNOSIS — J3081 Allergic rhinitis due to animal (cat) (dog) hair and dander: Secondary | ICD-10-CM | POA: Diagnosis not present

## 2017-05-18 DIAGNOSIS — M5441 Lumbago with sciatica, right side: Secondary | ICD-10-CM | POA: Diagnosis not present

## 2017-05-18 DIAGNOSIS — M4316 Spondylolisthesis, lumbar region: Secondary | ICD-10-CM | POA: Diagnosis not present

## 2017-05-18 DIAGNOSIS — J3089 Other allergic rhinitis: Secondary | ICD-10-CM | POA: Diagnosis not present

## 2017-05-20 DIAGNOSIS — J301 Allergic rhinitis due to pollen: Secondary | ICD-10-CM | POA: Diagnosis not present

## 2017-05-20 DIAGNOSIS — J3081 Allergic rhinitis due to animal (cat) (dog) hair and dander: Secondary | ICD-10-CM | POA: Diagnosis not present

## 2017-05-20 DIAGNOSIS — J3089 Other allergic rhinitis: Secondary | ICD-10-CM | POA: Diagnosis not present

## 2017-05-20 DIAGNOSIS — M4316 Spondylolisthesis, lumbar region: Secondary | ICD-10-CM | POA: Diagnosis not present

## 2017-05-24 DIAGNOSIS — M5441 Lumbago with sciatica, right side: Secondary | ICD-10-CM | POA: Diagnosis not present

## 2017-05-24 DIAGNOSIS — M4316 Spondylolisthesis, lumbar region: Secondary | ICD-10-CM | POA: Diagnosis not present

## 2017-05-27 DIAGNOSIS — J301 Allergic rhinitis due to pollen: Secondary | ICD-10-CM | POA: Diagnosis not present

## 2017-05-27 DIAGNOSIS — J3089 Other allergic rhinitis: Secondary | ICD-10-CM | POA: Diagnosis not present

## 2017-05-27 DIAGNOSIS — J3081 Allergic rhinitis due to animal (cat) (dog) hair and dander: Secondary | ICD-10-CM | POA: Diagnosis not present

## 2017-06-03 DIAGNOSIS — M25512 Pain in left shoulder: Secondary | ICD-10-CM | POA: Diagnosis not present

## 2017-06-03 DIAGNOSIS — L4059 Other psoriatic arthropathy: Secondary | ICD-10-CM | POA: Diagnosis not present

## 2017-06-03 DIAGNOSIS — J3081 Allergic rhinitis due to animal (cat) (dog) hair and dander: Secondary | ICD-10-CM | POA: Diagnosis not present

## 2017-06-03 DIAGNOSIS — M7989 Other specified soft tissue disorders: Secondary | ICD-10-CM | POA: Diagnosis not present

## 2017-06-03 DIAGNOSIS — M5136 Other intervertebral disc degeneration, lumbar region: Secondary | ICD-10-CM | POA: Diagnosis not present

## 2017-06-03 DIAGNOSIS — L409 Psoriasis, unspecified: Secondary | ICD-10-CM | POA: Diagnosis not present

## 2017-06-03 DIAGNOSIS — J301 Allergic rhinitis due to pollen: Secondary | ICD-10-CM | POA: Diagnosis not present

## 2017-06-03 DIAGNOSIS — M25511 Pain in right shoulder: Secondary | ICD-10-CM | POA: Diagnosis not present

## 2017-06-03 DIAGNOSIS — J3089 Other allergic rhinitis: Secondary | ICD-10-CM | POA: Diagnosis not present

## 2017-06-03 DIAGNOSIS — M8580 Other specified disorders of bone density and structure, unspecified site: Secondary | ICD-10-CM | POA: Diagnosis not present

## 2017-06-10 DIAGNOSIS — M4316 Spondylolisthesis, lumbar region: Secondary | ICD-10-CM | POA: Diagnosis not present

## 2017-06-10 DIAGNOSIS — M5441 Lumbago with sciatica, right side: Secondary | ICD-10-CM | POA: Diagnosis not present

## 2017-06-15 DIAGNOSIS — M5416 Radiculopathy, lumbar region: Secondary | ICD-10-CM | POA: Diagnosis not present

## 2017-06-15 DIAGNOSIS — M4316 Spondylolisthesis, lumbar region: Secondary | ICD-10-CM | POA: Diagnosis not present

## 2017-06-17 DIAGNOSIS — M5441 Lumbago with sciatica, right side: Secondary | ICD-10-CM | POA: Diagnosis not present

## 2017-07-06 DIAGNOSIS — M4316 Spondylolisthesis, lumbar region: Secondary | ICD-10-CM | POA: Diagnosis not present

## 2017-07-06 DIAGNOSIS — M5416 Radiculopathy, lumbar region: Secondary | ICD-10-CM | POA: Diagnosis not present

## 2017-07-08 DIAGNOSIS — M4316 Spondylolisthesis, lumbar region: Secondary | ICD-10-CM | POA: Diagnosis not present

## 2017-07-08 DIAGNOSIS — M5416 Radiculopathy, lumbar region: Secondary | ICD-10-CM | POA: Diagnosis not present

## 2017-07-09 DIAGNOSIS — J3089 Other allergic rhinitis: Secondary | ICD-10-CM | POA: Diagnosis not present

## 2017-07-09 DIAGNOSIS — J3081 Allergic rhinitis due to animal (cat) (dog) hair and dander: Secondary | ICD-10-CM | POA: Diagnosis not present

## 2017-07-09 DIAGNOSIS — J301 Allergic rhinitis due to pollen: Secondary | ICD-10-CM | POA: Diagnosis not present

## 2017-07-12 DIAGNOSIS — J301 Allergic rhinitis due to pollen: Secondary | ICD-10-CM | POA: Diagnosis not present

## 2017-07-12 DIAGNOSIS — J3081 Allergic rhinitis due to animal (cat) (dog) hair and dander: Secondary | ICD-10-CM | POA: Diagnosis not present

## 2017-07-12 DIAGNOSIS — J3089 Other allergic rhinitis: Secondary | ICD-10-CM | POA: Diagnosis not present

## 2017-07-13 DIAGNOSIS — M4316 Spondylolisthesis, lumbar region: Secondary | ICD-10-CM | POA: Diagnosis not present

## 2017-07-13 DIAGNOSIS — M5416 Radiculopathy, lumbar region: Secondary | ICD-10-CM | POA: Diagnosis not present

## 2017-07-15 DIAGNOSIS — J3081 Allergic rhinitis due to animal (cat) (dog) hair and dander: Secondary | ICD-10-CM | POA: Diagnosis not present

## 2017-07-15 DIAGNOSIS — J301 Allergic rhinitis due to pollen: Secondary | ICD-10-CM | POA: Diagnosis not present

## 2017-07-15 DIAGNOSIS — J3089 Other allergic rhinitis: Secondary | ICD-10-CM | POA: Diagnosis not present

## 2017-07-19 DIAGNOSIS — J301 Allergic rhinitis due to pollen: Secondary | ICD-10-CM | POA: Diagnosis not present

## 2017-07-19 DIAGNOSIS — J3089 Other allergic rhinitis: Secondary | ICD-10-CM | POA: Diagnosis not present

## 2017-07-19 DIAGNOSIS — J3081 Allergic rhinitis due to animal (cat) (dog) hair and dander: Secondary | ICD-10-CM | POA: Diagnosis not present

## 2017-07-26 DIAGNOSIS — J3089 Other allergic rhinitis: Secondary | ICD-10-CM | POA: Diagnosis not present

## 2017-07-26 DIAGNOSIS — J301 Allergic rhinitis due to pollen: Secondary | ICD-10-CM | POA: Diagnosis not present

## 2017-07-26 DIAGNOSIS — J3081 Allergic rhinitis due to animal (cat) (dog) hair and dander: Secondary | ICD-10-CM | POA: Diagnosis not present

## 2017-08-11 DIAGNOSIS — M5441 Lumbago with sciatica, right side: Secondary | ICD-10-CM | POA: Diagnosis not present

## 2017-08-11 DIAGNOSIS — M5416 Radiculopathy, lumbar region: Secondary | ICD-10-CM | POA: Diagnosis not present

## 2017-09-03 DIAGNOSIS — J3081 Allergic rhinitis due to animal (cat) (dog) hair and dander: Secondary | ICD-10-CM | POA: Diagnosis not present

## 2017-09-03 DIAGNOSIS — J3089 Other allergic rhinitis: Secondary | ICD-10-CM | POA: Diagnosis not present

## 2017-09-03 DIAGNOSIS — J301 Allergic rhinitis due to pollen: Secondary | ICD-10-CM | POA: Diagnosis not present

## 2017-09-20 ENCOUNTER — Ambulatory Visit (INDEPENDENT_AMBULATORY_CARE_PROVIDER_SITE_OTHER): Payer: BLUE CROSS/BLUE SHIELD | Admitting: *Deleted

## 2017-09-20 DIAGNOSIS — L409 Psoriasis, unspecified: Secondary | ICD-10-CM | POA: Diagnosis not present

## 2017-09-20 DIAGNOSIS — Z23 Encounter for immunization: Secondary | ICD-10-CM | POA: Diagnosis not present

## 2017-09-20 DIAGNOSIS — L821 Other seborrheic keratosis: Secondary | ICD-10-CM | POA: Diagnosis not present

## 2017-09-20 DIAGNOSIS — J3081 Allergic rhinitis due to animal (cat) (dog) hair and dander: Secondary | ICD-10-CM | POA: Diagnosis not present

## 2017-09-20 DIAGNOSIS — Q825 Congenital non-neoplastic nevus: Secondary | ICD-10-CM | POA: Diagnosis not present

## 2017-09-20 DIAGNOSIS — J3089 Other allergic rhinitis: Secondary | ICD-10-CM | POA: Diagnosis not present

## 2017-09-20 DIAGNOSIS — J301 Allergic rhinitis due to pollen: Secondary | ICD-10-CM | POA: Diagnosis not present

## 2017-10-14 DIAGNOSIS — J3089 Other allergic rhinitis: Secondary | ICD-10-CM | POA: Diagnosis not present

## 2017-10-14 DIAGNOSIS — J301 Allergic rhinitis due to pollen: Secondary | ICD-10-CM | POA: Diagnosis not present

## 2017-10-14 DIAGNOSIS — L4059 Other psoriatic arthropathy: Secondary | ICD-10-CM | POA: Diagnosis not present

## 2017-10-14 DIAGNOSIS — J3081 Allergic rhinitis due to animal (cat) (dog) hair and dander: Secondary | ICD-10-CM | POA: Diagnosis not present

## 2017-10-14 DIAGNOSIS — M5136 Other intervertebral disc degeneration, lumbar region: Secondary | ICD-10-CM | POA: Diagnosis not present

## 2017-10-14 DIAGNOSIS — M8580 Other specified disorders of bone density and structure, unspecified site: Secondary | ICD-10-CM | POA: Diagnosis not present

## 2017-11-26 DIAGNOSIS — J3081 Allergic rhinitis due to animal (cat) (dog) hair and dander: Secondary | ICD-10-CM | POA: Diagnosis not present

## 2017-11-26 DIAGNOSIS — J3089 Other allergic rhinitis: Secondary | ICD-10-CM | POA: Diagnosis not present

## 2017-11-26 DIAGNOSIS — J301 Allergic rhinitis due to pollen: Secondary | ICD-10-CM | POA: Diagnosis not present

## 2017-12-06 DIAGNOSIS — J301 Allergic rhinitis due to pollen: Secondary | ICD-10-CM | POA: Diagnosis not present

## 2017-12-06 DIAGNOSIS — J3089 Other allergic rhinitis: Secondary | ICD-10-CM | POA: Diagnosis not present

## 2017-12-06 DIAGNOSIS — J3081 Allergic rhinitis due to animal (cat) (dog) hair and dander: Secondary | ICD-10-CM | POA: Diagnosis not present

## 2018-01-13 DIAGNOSIS — J301 Allergic rhinitis due to pollen: Secondary | ICD-10-CM | POA: Diagnosis not present

## 2018-01-13 DIAGNOSIS — J3081 Allergic rhinitis due to animal (cat) (dog) hair and dander: Secondary | ICD-10-CM | POA: Diagnosis not present

## 2018-01-13 DIAGNOSIS — J3089 Other allergic rhinitis: Secondary | ICD-10-CM | POA: Diagnosis not present

## 2018-01-19 DIAGNOSIS — J3089 Other allergic rhinitis: Secondary | ICD-10-CM | POA: Diagnosis not present

## 2018-01-19 DIAGNOSIS — J3081 Allergic rhinitis due to animal (cat) (dog) hair and dander: Secondary | ICD-10-CM | POA: Diagnosis not present

## 2018-01-19 DIAGNOSIS — J301 Allergic rhinitis due to pollen: Secondary | ICD-10-CM | POA: Diagnosis not present

## 2018-01-21 DIAGNOSIS — J3089 Other allergic rhinitis: Secondary | ICD-10-CM | POA: Diagnosis not present

## 2018-01-21 DIAGNOSIS — J3081 Allergic rhinitis due to animal (cat) (dog) hair and dander: Secondary | ICD-10-CM | POA: Diagnosis not present

## 2018-01-21 DIAGNOSIS — J301 Allergic rhinitis due to pollen: Secondary | ICD-10-CM | POA: Diagnosis not present

## 2018-01-24 DIAGNOSIS — J3089 Other allergic rhinitis: Secondary | ICD-10-CM | POA: Diagnosis not present

## 2018-01-24 DIAGNOSIS — J301 Allergic rhinitis due to pollen: Secondary | ICD-10-CM | POA: Diagnosis not present

## 2018-01-24 DIAGNOSIS — J3081 Allergic rhinitis due to animal (cat) (dog) hair and dander: Secondary | ICD-10-CM | POA: Diagnosis not present

## 2018-01-28 DIAGNOSIS — J301 Allergic rhinitis due to pollen: Secondary | ICD-10-CM | POA: Diagnosis not present

## 2018-01-28 DIAGNOSIS — J3089 Other allergic rhinitis: Secondary | ICD-10-CM | POA: Diagnosis not present

## 2018-01-28 DIAGNOSIS — J3081 Allergic rhinitis due to animal (cat) (dog) hair and dander: Secondary | ICD-10-CM | POA: Diagnosis not present

## 2018-02-08 DIAGNOSIS — J301 Allergic rhinitis due to pollen: Secondary | ICD-10-CM | POA: Diagnosis not present

## 2018-02-08 DIAGNOSIS — L409 Psoriasis, unspecified: Secondary | ICD-10-CM | POA: Diagnosis not present

## 2018-02-08 DIAGNOSIS — Z79899 Other long term (current) drug therapy: Secondary | ICD-10-CM | POA: Diagnosis not present

## 2018-02-08 DIAGNOSIS — M2550 Pain in unspecified joint: Secondary | ICD-10-CM | POA: Diagnosis not present

## 2018-02-08 DIAGNOSIS — L4059 Other psoriatic arthropathy: Secondary | ICD-10-CM | POA: Diagnosis not present

## 2018-02-08 DIAGNOSIS — M8580 Other specified disorders of bone density and structure, unspecified site: Secondary | ICD-10-CM | POA: Diagnosis not present

## 2018-02-08 DIAGNOSIS — J3081 Allergic rhinitis due to animal (cat) (dog) hair and dander: Secondary | ICD-10-CM | POA: Diagnosis not present

## 2018-02-08 DIAGNOSIS — J3089 Other allergic rhinitis: Secondary | ICD-10-CM | POA: Diagnosis not present

## 2018-02-08 DIAGNOSIS — M5136 Other intervertebral disc degeneration, lumbar region: Secondary | ICD-10-CM | POA: Diagnosis not present

## 2018-02-09 ENCOUNTER — Other Ambulatory Visit: Payer: Self-pay | Admitting: Obstetrics and Gynecology

## 2018-02-09 DIAGNOSIS — Z1231 Encounter for screening mammogram for malignant neoplasm of breast: Secondary | ICD-10-CM

## 2018-02-24 DIAGNOSIS — J3089 Other allergic rhinitis: Secondary | ICD-10-CM | POA: Diagnosis not present

## 2018-02-24 DIAGNOSIS — J301 Allergic rhinitis due to pollen: Secondary | ICD-10-CM | POA: Diagnosis not present

## 2018-02-24 DIAGNOSIS — J3081 Allergic rhinitis due to animal (cat) (dog) hair and dander: Secondary | ICD-10-CM | POA: Diagnosis not present

## 2018-03-07 ENCOUNTER — Ambulatory Visit
Admission: RE | Admit: 2018-03-07 | Discharge: 2018-03-07 | Disposition: A | Discharge status: Home / Self Care | Payer: BLUE CROSS/BLUE SHIELD | Source: Ambulatory Visit | Attending: Obstetrics and Gynecology | Admitting: Obstetrics and Gynecology

## 2018-03-07 DIAGNOSIS — J3081 Allergic rhinitis due to animal (cat) (dog) hair and dander: Secondary | ICD-10-CM | POA: Diagnosis not present

## 2018-03-07 DIAGNOSIS — Z1231 Encounter for screening mammogram for malignant neoplasm of breast: Secondary | ICD-10-CM | POA: Diagnosis not present

## 2018-03-07 DIAGNOSIS — J301 Allergic rhinitis due to pollen: Secondary | ICD-10-CM | POA: Diagnosis not present

## 2018-03-07 DIAGNOSIS — J3089 Other allergic rhinitis: Secondary | ICD-10-CM | POA: Diagnosis not present

## 2018-03-31 DIAGNOSIS — J3089 Other allergic rhinitis: Secondary | ICD-10-CM | POA: Diagnosis not present

## 2018-03-31 DIAGNOSIS — J301 Allergic rhinitis due to pollen: Secondary | ICD-10-CM | POA: Diagnosis not present

## 2018-03-31 DIAGNOSIS — J3081 Allergic rhinitis due to animal (cat) (dog) hair and dander: Secondary | ICD-10-CM | POA: Diagnosis not present

## 2018-04-01 ENCOUNTER — Encounter: Payer: Self-pay | Admitting: Family Medicine

## 2018-04-01 ENCOUNTER — Ambulatory Visit (INDEPENDENT_AMBULATORY_CARE_PROVIDER_SITE_OTHER): Payer: BLUE CROSS/BLUE SHIELD | Admitting: Family Medicine

## 2018-04-01 VITALS — BP 130/80 | HR 64 | Temp 98.4°F | Wt 156.8 lb

## 2018-04-01 DIAGNOSIS — M25511 Pain in right shoulder: Secondary | ICD-10-CM

## 2018-04-01 DIAGNOSIS — G8929 Other chronic pain: Secondary | ICD-10-CM | POA: Diagnosis not present

## 2018-04-01 DIAGNOSIS — M25512 Pain in left shoulder: Secondary | ICD-10-CM

## 2018-04-01 NOTE — Progress Notes (Signed)
  Subjective:     Patient ID: Abigail Hayes, female   DOB: 02-21-1961, 57 y.o.   MRN: [id number]  HPI Patient seen with bilateral shoulder pain right greater than left. Duration over one year. She has history of psoriatic arthritis and is followed by rheumatology regularly. She states her rheumatologist recently obtained plain films of both shoulders without any acute abnormality. No recent neck pain.  Denies injury.  She likes to swim for exercise but has not been able to swim now for many months-secondary to shoulder pain.. She has been doing some home therapy theraband exercises without improvement. Increasing night pain recently. She has pain with external rotation and abduction. No definite weakness.  She is on immune modulating drug (Cosentyx) per rheumatology.  Past Medical History:  Diagnosis Date  . Chicken pox   . Enlargement of lymph nodes 07/17/2009  . PSORIATIC ARTHRITIS 07/17/2009   Past Surgical History:  Procedure Laterality Date  . ANTERIOR CRUCIATE LIGAMENT REPAIR  2006    reports that she has never smoked. She has never used smokeless tobacco. She reports that she drinks alcohol. She reports that she does not use drugs. family history includes Cancer in her father and other; Cancer (age of onset: 72) in her mother; Heart disease in her other; Hyperlipidemia in her other; Hypertension in her other. Allergies  Allergen Reactions  . Other     Red meat     Review of Systems  Musculoskeletal: Negative for neck pain.  Neurological: Negative for weakness and numbness.       Objective:   Physical Exam  Constitutional: She appears well-developed and well-nourished.  Cardiovascular: Normal rate and regular rhythm.  Pulmonary/Chest: Effort normal and breath sounds normal.  Musculoskeletal: She exhibits no edema.  Full range of motion both shoulders. Minimal pain with abduction greater than 90 against resistance.  No localized tenderness  Neurological:  No appreciable  rotator cuff weakness       Assessment:     Patient presents with one year history of bilateral shoulder pain right greater than left starting to interfere with daily activities. Question chronic rotator cuff tendinitis    Plan:     -Given duration of symptoms set up sports medicine referral for further evaluate. Patient already trying home exercises for months without improvement.  She agrees with this plan  Eulas Post MD [facility] at [facility]

## 2018-04-01 NOTE — Patient Instructions (Signed)
We will set up sports medicine referral.  

## 2018-04-25 NOTE — Progress Notes (Signed)
[facility] [address], [place] [address] Phone: [phone] Subjective:     CC: bilateral shoulder pain   DDU:[id number]  Abigail Hayes is a 57 y.o. female coming in with complaint of bilateral shoulder pain. Right shoulder is worse. No numbness and tingling noted. Has never had injections.   Onset- Chronic Location- Anterior Character- Sharp Aggravating factors- Swimming, flexion Therapies tried- Rehab, Ice, heat Severity-7 out of 10     Past Medical History:  Diagnosis Date  . Chicken pox   . Enlargement of lymph nodes 07/17/2009  . PSORIATIC ARTHRITIS 07/17/2009   Past Surgical History:  Procedure Laterality Date  . ANTERIOR CRUCIATE LIGAMENT REPAIR  2006   Social History   Socioeconomic History  . Marital status: Married    Spouse name: Not on file  . Number of children: 3  . Years of education: Not on file  . Highest education level: Not on file  Occupational History  . Occupation: Copywriter, advertising: [employer]  Social Needs  . Financial resource strain: Not on file  . Food insecurity:    Worry: Not on file    Inability: Not on file  . Transportation needs:    Medical: Not on file    Non-medical: Not on file  Tobacco Use  . Smoking status: Never Smoker  . Smokeless tobacco: Never Used  Substance and Sexual Activity  . Alcohol use: Yes  . Drug use: No  . Sexual activity: Not on file  Lifestyle  . Physical activity:    Days per week: Not on file    Minutes per session: Not on file  . Stress: Not on file  Relationships  . Social connections:    Talks on phone: Not on file    Gets together: Not on file    Attends religious service: Not on file    Active member of club or organization: Not on file    Attends meetings of clubs or organizations: Not on file    Relationship status: Not on file  Other Topics Concern  . Not on file  Social History Narrative  . Not on file   Allergies  Allergen Reactions  .  Other     Red meat   Family History  Problem Relation Age of Onset  . Hyperlipidemia Other   . Hypertension Other   . Cancer Other        ovarian, throat  . Heart disease Other   . Cancer Mother 57       endometrial cancer  . Cancer Father        ?tonsillar cancer     Past medical history, social, surgical and family history all reviewed in electronic medical record.  No pertanent information unless stated regarding to the chief complaint.   Review of Systems:Review of systems updated and as accurate as of 04/26/18  No headache, visual changes, nausea, vomiting, diarrhea, constipation, dizziness, abdominal pain, skin rash, fevers, chills, night sweats, weight loss, swollen lymph nodes, body aches, joint swelling, , chest pain, shortness of breath, mood changes.  Positive muscle aches  Objective  Blood pressure 100/82, pulse 78, height 5\' 4"  (1.626 m), weight 154 lb (69.9 kg), last menstrual period 06/03/2011, SpO2 98 %. Systems examined below as of 04/26/18   General: No apparent distress alert and oriented x3 mood and affect normal, dressed appropriately.  HEENT: Pupils equal, extraocular movements intact  Respiratory: Patient's speak in full sentences and does  not appear short of breath  Cardiovascular: No lower extremity edema, non tender, no erythema  Skin: Warm dry intact with no signs of infection or rash on extremities or on axial skeleton.  Abdomen: Soft nontender  Neuro: Cranial nerves II through XII are intact, neurovascularly intact in all extremities with 2+ DTRs and 2+ pulses.  Lymph: No lymphadenopathy of posterior or anterior cervical chain or axillae bilaterally.  Gait normal with good balance and coordination.  MSK:  Non tender with full range of motion and good stability and symmetric strength and tone of  elbows, wrist, hip, knee and ankles bilaterally.  Shoulder: Right Inspection reveals no abnormalities, atrophy or asymmetry. Palpation is normal with no  tenderness over AC joint or bicipital groove. ROM is full in all planes. Rotator cuff strength normal throughout. Positive impingement Speeds and Yergason's tests normal. Positive O'Brien's Normal scapular function observed. No painful arc and no drop arm sign. No apprehension sign Contralateral shoulder unremarkable  MSK US performed of: right  This study was ordered, performed, and interpreted by Charlann Boxer D.O.  Shoulder:   Supraspinatus: Patient does have some mild degenerative changes noted. Subscapularis:  Appears normal on long and transverse views.  Possible cyst formation noted.  Seems to be more of a rheumatoid nodule AC joint: Patient does have some arthritic changes of the acromioclavicular joint Glenohumeral Joint:  Appears normal without effusion. Glenoid Labrum: Mild degenerative changes Biceps Tendon:  Appears normal on long and transverse views, no fraying of tendon, tendon located in intertubercular groove, no subluxation with shoulder internal or external rotation. No increased power doppler signal. 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  This included exercises focusing on stretching, strengthening, with significant focus on eccentric aspects.   Long term goals include an improvement in range of motion, strength, endurance as well as avoiding reinjury. Patient's frequency would include in 1-2 times a day, 3-5 times a week for a duration of 6-12 weeks. Shoulder Exercises that included:  Basic scapular stabilization to include adduction and depression of scapula Scaption, focusing on proper movement and good control Internal and External rotation utilizing a theraband, with elbow tucked at side entire time Rows with theraband given   Proper technique shown and discussed handout in great detail with ATC.  All questions were discussed and  answered.      Impression and Recommendations:     This case required medical decision making of moderate complexity.      Note: This dictation was prepared with Dragon dictation along with smaller phrase technology. Any transcriptional errors that result from this process are unintentional.

## 2018-04-26 ENCOUNTER — Ambulatory Visit (INDEPENDENT_AMBULATORY_CARE_PROVIDER_SITE_OTHER)
Admission: RE | Admit: 2018-04-26 | Discharge: 2018-04-26 | Disposition: A | Discharge status: Home / Self Care | Payer: BLUE CROSS/BLUE SHIELD | Source: Ambulatory Visit | Attending: Family Medicine | Admitting: Family Medicine

## 2018-04-26 ENCOUNTER — Ambulatory Visit (INDEPENDENT_AMBULATORY_CARE_PROVIDER_SITE_OTHER): Payer: BLUE CROSS/BLUE SHIELD | Admitting: Family Medicine

## 2018-04-26 ENCOUNTER — Ambulatory Visit: Payer: Self-pay

## 2018-04-26 ENCOUNTER — Encounter: Payer: Self-pay | Admitting: Family Medicine

## 2018-04-26 VITALS — BP 100/82 | HR 78 | Ht 64.0 in | Wt 154.0 lb

## 2018-04-26 DIAGNOSIS — J3081 Allergic rhinitis due to animal (cat) (dog) hair and dander: Secondary | ICD-10-CM | POA: Diagnosis not present

## 2018-04-26 DIAGNOSIS — M7541 Impingement syndrome of right shoulder: Secondary | ICD-10-CM | POA: Diagnosis not present

## 2018-04-26 DIAGNOSIS — G8929 Other chronic pain: Secondary | ICD-10-CM

## 2018-04-26 DIAGNOSIS — M25511 Pain in right shoulder: Secondary | ICD-10-CM

## 2018-04-26 DIAGNOSIS — J3089 Other allergic rhinitis: Secondary | ICD-10-CM | POA: Diagnosis not present

## 2018-04-26 DIAGNOSIS — J301 Allergic rhinitis due to pollen: Secondary | ICD-10-CM | POA: Diagnosis not present

## 2018-04-26 MED ORDER — DICLOFENAC SODIUM 2 % TD SOLN: 2.0000 g | Freq: Two times a day (BID) | TRANSDERMAL | 3 refills | Status: DC

## 2018-04-26 MED ORDER — VITAMIN D (ERGOCALCIFEROL) 1.25 MG (50000 UNIT) PO CAPS: 50000.0000 [IU] | ORAL_CAPSULE | ORAL | 0 refills | Status: DC

## 2018-04-26 NOTE — Assessment & Plan Note (Signed)
Impingement.  Discussed icing regimen and home exercise.  Home exercises given and work with Product/process development scientist.  Discussed topical anti-inflammatories, once weekly vitamin D.  X-rays ordered.  Follow-up again in 4 to 6 weeks

## 2018-04-26 NOTE — Patient Instructions (Addendum)
Good to see you  Ice 20 minutes 2 times daily. Usually after activity and before bed. pennsaid pinkie amount topically 2 times daily as needed.  Once weekly vitamin D for 12 weeks Exercises 3 times a week.  Keep hands within peripheral vision.  Stay active though and listen to your body  Turmeric 500mg  1-2 times a day  See me again in 4 weeks

## 2018-05-03 DIAGNOSIS — J301 Allergic rhinitis due to pollen: Secondary | ICD-10-CM | POA: Diagnosis not present

## 2018-05-03 DIAGNOSIS — Z91018 Allergy to other foods: Secondary | ICD-10-CM | POA: Diagnosis not present

## 2018-05-03 DIAGNOSIS — R062 Wheezing: Secondary | ICD-10-CM | POA: Diagnosis not present

## 2018-05-03 DIAGNOSIS — J3089 Other allergic rhinitis: Secondary | ICD-10-CM | POA: Diagnosis not present

## 2018-05-03 DIAGNOSIS — J3081 Allergic rhinitis due to animal (cat) (dog) hair and dander: Secondary | ICD-10-CM | POA: Diagnosis not present

## 2018-05-03 DIAGNOSIS — R21 Rash and other nonspecific skin eruption: Secondary | ICD-10-CM | POA: Diagnosis not present

## 2018-05-04 DIAGNOSIS — Z803 Family history of malignant neoplasm of breast: Secondary | ICD-10-CM | POA: Diagnosis not present

## 2018-05-04 DIAGNOSIS — Z8049 Family history of malignant neoplasm of other genital organs: Secondary | ICD-10-CM | POA: Diagnosis not present

## 2018-05-04 DIAGNOSIS — J3089 Other allergic rhinitis: Secondary | ICD-10-CM | POA: Diagnosis not present

## 2018-05-04 DIAGNOSIS — Z808 Family history of malignant neoplasm of other organs or systems: Secondary | ICD-10-CM | POA: Diagnosis not present

## 2018-05-04 DIAGNOSIS — Z6827 Body mass index (BMI) 27.0-27.9, adult: Secondary | ICD-10-CM | POA: Diagnosis not present

## 2018-05-04 DIAGNOSIS — N3281 Overactive bladder: Secondary | ICD-10-CM | POA: Diagnosis not present

## 2018-05-04 DIAGNOSIS — N390 Urinary tract infection, site not specified: Secondary | ICD-10-CM | POA: Diagnosis not present

## 2018-05-04 DIAGNOSIS — L4059 Other psoriatic arthropathy: Secondary | ICD-10-CM | POA: Diagnosis not present

## 2018-05-04 DIAGNOSIS — M5136 Other intervertebral disc degeneration, lumbar region: Secondary | ICD-10-CM | POA: Diagnosis not present

## 2018-05-04 DIAGNOSIS — Z8041 Family history of malignant neoplasm of ovary: Secondary | ICD-10-CM | POA: Diagnosis not present

## 2018-05-04 DIAGNOSIS — Z01419 Encounter for gynecological examination (general) (routine) without abnormal findings: Secondary | ICD-10-CM | POA: Diagnosis not present

## 2018-05-24 ENCOUNTER — Ambulatory Visit: Payer: BLUE CROSS/BLUE SHIELD | Admitting: Family Medicine

## 2018-06-02 DIAGNOSIS — J301 Allergic rhinitis due to pollen: Secondary | ICD-10-CM | POA: Diagnosis not present

## 2018-06-02 DIAGNOSIS — J3089 Other allergic rhinitis: Secondary | ICD-10-CM | POA: Diagnosis not present

## 2018-06-02 DIAGNOSIS — J3081 Allergic rhinitis due to animal (cat) (dog) hair and dander: Secondary | ICD-10-CM | POA: Diagnosis not present

## 2018-06-06 DIAGNOSIS — J3089 Other allergic rhinitis: Secondary | ICD-10-CM | POA: Diagnosis not present

## 2018-06-06 DIAGNOSIS — J3081 Allergic rhinitis due to animal (cat) (dog) hair and dander: Secondary | ICD-10-CM | POA: Diagnosis not present

## 2018-06-06 DIAGNOSIS — J301 Allergic rhinitis due to pollen: Secondary | ICD-10-CM | POA: Diagnosis not present

## 2018-06-08 NOTE — Progress Notes (Signed)
[facility] [address], [place] [address] Phone: [phone] Subjective:   [name], am serving as a scribe for Dr. Hulan Saas.   I'm seeing this patient by the request  of:    CC: Right shoulder pain follow-up  RKY:[id number]  Abigail Hayes is a 57 y.o. female coming in with complaint of shoulder pain. She said that she is not doing any better.  Patient has been doing the exercises intermittently.  Taking the vitamin D.  Feels well that unfortunately with certain movements a sharp pain occurs.       Past Medical History:  Diagnosis Date  . Chicken pox   . Enlargement of lymph nodes 07/17/2009  . PSORIATIC ARTHRITIS 07/17/2009   Past Surgical History:  Procedure Laterality Date  . ANTERIOR CRUCIATE LIGAMENT REPAIR  2006   Social History   Socioeconomic History  . Marital status: Married    Spouse name: Not on file  . Number of children: 3  . Years of education: Not on file  . Highest education level: Not on file  Occupational History  . Occupation: Copywriter, advertising: [employer]  Social Needs  . Financial resource strain: Not on file  . Food insecurity:    Worry: Not on file    Inability: Not on file  . Transportation needs:    Medical: Not on file    Non-medical: Not on file  Tobacco Use  . Smoking status: Never Smoker  . Smokeless tobacco: Never Used  Substance and Sexual Activity  . Alcohol use: Yes  . Drug use: No  . Sexual activity: Not on file  Lifestyle  . Physical activity:    Days per week: Not on file    Minutes per session: Not on file  . Stress: Not on file  Relationships  . Social connections:    Talks on phone: Not on file    Gets together: Not on file    Attends religious service: Not on file    Active member of club or organization: Not on file    Attends meetings of clubs or organizations: Not on file    Relationship status: Not on file  Other Topics Concern  . Not on file  Social  History Narrative  . Not on file   Allergies  Allergen Reactions  . Other     Red meat   Family History  Problem Relation Age of Onset  . Hyperlipidemia Other   . Hypertension Other   . Cancer Other        ovarian, throat  . Heart disease Other   . Cancer Mother 80       endometrial cancer  . Cancer Father        ?tonsillar cancer       Current Outpatient Medications (Analgesics):  .  ibuprofen (ADVIL,MOTRIN) 200 MG tablet, Take 200 mg by mouth every 6 (six) hours as needed for pain.   Current Outpatient Medications (Other):  .  Secukinumab (COSENTYX SENSOREADY 300 DOSE) 150 MG/ML SOAJ, Inject into the skin. .  Vitamin D, Ergocalciferol, (DRISDOL) 50000 units CAPS capsule, Take 1 capsule (50,000 Units total) by mouth every 7 (seven) days. Marland Kitchen  zolpidem (AMBIEN) 5 MG tablet, Take 1 tablet (5 mg total) by mouth at bedtime as needed for sleep.    Past medical history, social, surgical and family history all reviewed in electronic medical record.  No pertanent information unless stated regarding to  the chief complaint.   Review of Systems:  No headache, visual changes, nausea, vomiting, diarrhea, constipation, dizziness, abdominal pain, skin rash, fevers, chills, night sweats, weight loss, swollen lymph nodes, body aches, joint swelling chest pain, shortness of breath, mood changes.  Positive muscle aches  Objective  Blood pressure 102/80, pulse 76, height 5\' 4"  (1.626 m), weight 154 lb (69.9 kg), last menstrual period 06/03/2011, SpO2 95 %.    General: No apparent distress alert and oriented x3 mood and affect normal, dressed appropriately.  HEENT: Pupils equal, extraocular movements intact  Respiratory: Patient's speak in full sentences and does not appear short of breath  Cardiovascular: No lower extremity edema, non tender, no erythema  Skin: Warm dry intact with no signs of infection or rash on extremities or on axial skeleton.  Abdomen: Soft nontender  Neuro: Cranial  nerves II through XII are intact, neurovascularly intact in all extremities with 2+ DTRs and 2+ pulses.  Lymph: No lymphadenopathy of posterior or anterior cervical chain or axillae bilaterally.  Gait normal with good balance and coordination.  MSK:  Non tender with full range of motion and good stability and symmetric strength and tone of  elbows, wrist, hip, knee and ankles bilaterally.  Shoulder: Right Inspection reveals no abnormalities, atrophy or asymmetry. Palpation is normal with no tenderness over AC joint or bicipital groove. ROM is full in all planes passively. Rotator cuff strength normal throughout. signs of impingement with positive Neer and Hawkin's tests, but negative empty can sign. Speeds and Yergason's tests normal. No labral pathology noted with negative Obrien's, negative clunk and good stability. Normal scapular function observed. No painful arc and no drop arm sign. No apprehension sign    Procedure: Real-time Ultrasound Guided Injection of right glenohumeral joint Device: GE Logiq E  Ultrasound guided injection is preferred based studies that show increased duration, increased effect, greater accuracy, decreased procedural pain, increased response rate with ultrasound guided versus blind injection.  Verbal informed consent obtained.  Time-out conducted.  Noted no overlying erythema, induration, or other signs of local infection.  Skin prepped in a sterile fashion.  Local anesthesia: Topical Ethyl chloride.  With sterile technique and under real time ultrasound guidance:  Joint visualized.  23g 1  inch needle inserted anterior approach. Pictures taken for needle placement. Patient did have injection of 2 cc of 1% lidocaine, 2 cc of 0.5% Marcaine, and 1.0 cc of Kenalog 40 mg/dL. Completed without difficulty  Pain immediately resolved suggesting accurate placement of the medication.  Advised to call if fevers/chills, erythema, induration, drainage, or persistent  bleeding.  Images permanently stored and available for review in the ultrasound unit.  Impression: Technically successful ultrasound guided injection.    Impression and Recommendations:     This case required medical decision making of moderate complexity. The above documentation has been reviewed and is accurate and complete Abigail Pulley, DO       Note: This dictation was prepared with Dragon dictation along with smaller phrase technology. Any transcriptional errors that result from this process are unintentional.

## 2018-06-09 ENCOUNTER — Encounter: Payer: Self-pay | Admitting: Family Medicine

## 2018-06-09 ENCOUNTER — Ambulatory Visit (INDEPENDENT_AMBULATORY_CARE_PROVIDER_SITE_OTHER)
Admission: RE | Admit: 2018-06-09 | Discharge: 2018-06-09 | Disposition: A | Discharge status: Home / Self Care | Payer: BLUE CROSS/BLUE SHIELD | Source: Ambulatory Visit | Attending: Family Medicine | Admitting: Family Medicine

## 2018-06-09 ENCOUNTER — Ambulatory Visit (INDEPENDENT_AMBULATORY_CARE_PROVIDER_SITE_OTHER): Payer: BLUE CROSS/BLUE SHIELD | Admitting: Family Medicine

## 2018-06-09 ENCOUNTER — Ambulatory Visit: Payer: Self-pay

## 2018-06-09 VITALS — BP 102/80 | HR 76 | Ht 64.0 in | Wt 154.0 lb

## 2018-06-09 DIAGNOSIS — G8929 Other chronic pain: Secondary | ICD-10-CM | POA: Diagnosis not present

## 2018-06-09 DIAGNOSIS — M25511 Pain in right shoulder: Secondary | ICD-10-CM

## 2018-06-09 DIAGNOSIS — J3081 Allergic rhinitis due to animal (cat) (dog) hair and dander: Secondary | ICD-10-CM | POA: Diagnosis not present

## 2018-06-09 DIAGNOSIS — M7541 Impingement syndrome of right shoulder: Secondary | ICD-10-CM | POA: Diagnosis not present

## 2018-06-09 DIAGNOSIS — J301 Allergic rhinitis due to pollen: Secondary | ICD-10-CM | POA: Diagnosis not present

## 2018-06-09 DIAGNOSIS — J3089 Other allergic rhinitis: Secondary | ICD-10-CM | POA: Diagnosis not present

## 2018-06-09 NOTE — Patient Instructions (Signed)
Good to see you  We will get you in with pt  Xray downstairs Injected the nodule today  See me again in 4-6 weeks

## 2018-06-09 NOTE — Assessment & Plan Note (Signed)
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  Injected today anteriorly.  Sent to formal physical therapy to help with the scapular movement.  Discussed icing regimen, home exercises topical anti-inflammatories and continue the vitamin D.  X-rays ordered.  Follow-up again in 4 to 6 weeks.  Worsening symptoms consider advanced imaging

## 2018-06-14 DIAGNOSIS — J301 Allergic rhinitis due to pollen: Secondary | ICD-10-CM | POA: Diagnosis not present

## 2018-06-14 DIAGNOSIS — Z13820 Encounter for screening for osteoporosis: Secondary | ICD-10-CM | POA: Diagnosis not present

## 2018-06-14 DIAGNOSIS — Z809 Family history of malignant neoplasm, unspecified: Secondary | ICD-10-CM | POA: Diagnosis not present

## 2018-06-14 DIAGNOSIS — J3081 Allergic rhinitis due to animal (cat) (dog) hair and dander: Secondary | ICD-10-CM | POA: Diagnosis not present

## 2018-06-14 DIAGNOSIS — J3089 Other allergic rhinitis: Secondary | ICD-10-CM | POA: Diagnosis not present

## 2018-06-16 DIAGNOSIS — J301 Allergic rhinitis due to pollen: Secondary | ICD-10-CM | POA: Diagnosis not present

## 2018-06-16 DIAGNOSIS — J3089 Other allergic rhinitis: Secondary | ICD-10-CM | POA: Diagnosis not present

## 2018-06-16 DIAGNOSIS — J3081 Allergic rhinitis due to animal (cat) (dog) hair and dander: Secondary | ICD-10-CM | POA: Diagnosis not present

## 2018-07-06 DIAGNOSIS — J301 Allergic rhinitis due to pollen: Secondary | ICD-10-CM | POA: Diagnosis not present

## 2018-07-06 DIAGNOSIS — J3081 Allergic rhinitis due to animal (cat) (dog) hair and dander: Secondary | ICD-10-CM | POA: Diagnosis not present

## 2018-07-06 DIAGNOSIS — Z23 Encounter for immunization: Secondary | ICD-10-CM | POA: Diagnosis not present

## 2018-07-06 DIAGNOSIS — J3089 Other allergic rhinitis: Secondary | ICD-10-CM | POA: Diagnosis not present

## 2018-07-14 ENCOUNTER — Ambulatory Visit: Payer: BLUE CROSS/BLUE SHIELD | Admitting: Family Medicine

## 2018-08-08 DIAGNOSIS — J3089 Other allergic rhinitis: Secondary | ICD-10-CM | POA: Diagnosis not present

## 2018-08-08 DIAGNOSIS — J301 Allergic rhinitis due to pollen: Secondary | ICD-10-CM | POA: Diagnosis not present

## 2018-08-08 DIAGNOSIS — J3081 Allergic rhinitis due to animal (cat) (dog) hair and dander: Secondary | ICD-10-CM | POA: Diagnosis not present

## 2018-08-28 ENCOUNTER — Other Ambulatory Visit: Payer: Self-pay | Admitting: Family Medicine

## 2018-08-31 DIAGNOSIS — J3089 Other allergic rhinitis: Secondary | ICD-10-CM | POA: Diagnosis not present

## 2018-08-31 DIAGNOSIS — L4059 Other psoriatic arthropathy: Secondary | ICD-10-CM | POA: Diagnosis not present

## 2018-08-31 DIAGNOSIS — E559 Vitamin D deficiency, unspecified: Secondary | ICD-10-CM | POA: Diagnosis not present

## 2018-08-31 DIAGNOSIS — J301 Allergic rhinitis due to pollen: Secondary | ICD-10-CM | POA: Diagnosis not present

## 2018-08-31 DIAGNOSIS — Z79899 Other long term (current) drug therapy: Secondary | ICD-10-CM | POA: Diagnosis not present

## 2018-08-31 DIAGNOSIS — J3081 Allergic rhinitis due to animal (cat) (dog) hair and dander: Secondary | ICD-10-CM | POA: Diagnosis not present

## 2018-08-31 LAB — VITAMIN D 25 HYDROXY (VIT D DEFICIENCY, FRACTURES): VIT D 25 HYDROXY: 37.4

## 2018-08-31 LAB — BASIC METABOLIC PANEL
BUN: 14 (ref 4–21)
Creatinine: 0.8 (ref <=1.1)
Glucose: 79
POTASSIUM: 5.1 (ref 3.4–5.3)
Sodium: 142 (ref 137–147)

## 2018-08-31 LAB — HEPATIC FUNCTION PANEL
ALK PHOS: 94 (ref 25–125)
ALT: 17 (ref 7–35)
AST: 17 (ref 13–35)
Bilirubin, Total: 0.3

## 2018-08-31 LAB — CBC AND DIFFERENTIAL
HEMATOCRIT: 45 (ref 36–46)
HEMOGLOBIN: 14.8 (ref 12.0–16.0)
Platelets: 372 (ref 150–399)
WBC: 4.1

## 2018-09-23 DIAGNOSIS — J3081 Allergic rhinitis due to animal (cat) (dog) hair and dander: Secondary | ICD-10-CM | POA: Diagnosis not present

## 2018-09-23 DIAGNOSIS — J3089 Other allergic rhinitis: Secondary | ICD-10-CM | POA: Diagnosis not present

## 2018-09-23 DIAGNOSIS — J301 Allergic rhinitis due to pollen: Secondary | ICD-10-CM | POA: Diagnosis not present

## 2019-05-11 ENCOUNTER — Other Ambulatory Visit: Payer: Self-pay | Admitting: Obstetrics and Gynecology

## 2019-05-11 DIAGNOSIS — Z1231 Encounter for screening mammogram for malignant neoplasm of breast: Secondary | ICD-10-CM

## 2019-05-30 DIAGNOSIS — Z01419 Encounter for gynecological examination (general) (routine) without abnormal findings: Secondary | ICD-10-CM | POA: Diagnosis not present

## 2019-05-30 DIAGNOSIS — Z6827 Body mass index (BMI) 27.0-27.9, adult: Secondary | ICD-10-CM | POA: Diagnosis not present

## 2019-05-30 DIAGNOSIS — N952 Postmenopausal atrophic vaginitis: Secondary | ICD-10-CM | POA: Diagnosis not present

## 2019-06-13 DIAGNOSIS — M5136 Other intervertebral disc degeneration, lumbar region: Secondary | ICD-10-CM | POA: Diagnosis not present

## 2019-06-13 DIAGNOSIS — M545 Low back pain: Secondary | ICD-10-CM | POA: Diagnosis not present

## 2019-06-13 DIAGNOSIS — M4180 Other forms of scoliosis, site unspecified: Secondary | ICD-10-CM | POA: Diagnosis not present

## 2019-06-13 DIAGNOSIS — M4310 Spondylolisthesis, site unspecified: Secondary | ICD-10-CM | POA: Diagnosis not present

## 2019-06-26 ENCOUNTER — Ambulatory Visit
Admission: RE | Admit: 2019-06-26 | Discharge: 2019-06-26 | Disposition: A | Discharge status: Home / Self Care | Payer: BLUE CROSS/BLUE SHIELD | Source: Ambulatory Visit | Attending: Obstetrics and Gynecology | Admitting: Obstetrics and Gynecology

## 2019-06-26 ENCOUNTER — Other Ambulatory Visit: Payer: Self-pay

## 2019-06-26 DIAGNOSIS — Z1231 Encounter for screening mammogram for malignant neoplasm of breast: Secondary | ICD-10-CM

## 2019-10-05 DIAGNOSIS — M8580 Other specified disorders of bone density and structure, unspecified site: Secondary | ICD-10-CM | POA: Diagnosis not present

## 2019-10-05 DIAGNOSIS — E559 Vitamin D deficiency, unspecified: Secondary | ICD-10-CM | POA: Diagnosis not present

## 2019-10-05 DIAGNOSIS — Z79899 Other long term (current) drug therapy: Secondary | ICD-10-CM | POA: Diagnosis not present

## 2019-10-05 DIAGNOSIS — L4059 Other psoriatic arthropathy: Secondary | ICD-10-CM | POA: Diagnosis not present

## 2019-10-06 DIAGNOSIS — L4059 Other psoriatic arthropathy: Secondary | ICD-10-CM | POA: Diagnosis not present

## 2020-02-01 DIAGNOSIS — Z79899 Other long term (current) drug therapy: Secondary | ICD-10-CM | POA: Diagnosis not present

## 2020-02-01 DIAGNOSIS — L4059 Other psoriatic arthropathy: Secondary | ICD-10-CM | POA: Diagnosis not present

## 2020-02-01 DIAGNOSIS — L409 Psoriasis, unspecified: Secondary | ICD-10-CM | POA: Diagnosis not present

## 2020-02-01 DIAGNOSIS — E559 Vitamin D deficiency, unspecified: Secondary | ICD-10-CM | POA: Diagnosis not present

## 2020-04-03 ENCOUNTER — Encounter: Payer: Self-pay | Admitting: Family Medicine

## 2020-04-03 ENCOUNTER — Other Ambulatory Visit: Payer: Self-pay

## 2020-04-03 ENCOUNTER — Encounter: Payer: Self-pay | Admitting: Neurology

## 2020-04-03 ENCOUNTER — Ambulatory Visit (INDEPENDENT_AMBULATORY_CARE_PROVIDER_SITE_OTHER)
Admission: RE | Admit: 2020-04-03 | Discharge: 2020-04-03 | Disposition: A | Discharge status: Home / Self Care | Payer: BLUE CROSS/BLUE SHIELD | Source: Ambulatory Visit | Attending: Family Medicine | Admitting: Family Medicine

## 2020-04-03 ENCOUNTER — Ambulatory Visit (INDEPENDENT_AMBULATORY_CARE_PROVIDER_SITE_OTHER): Payer: BLUE CROSS/BLUE SHIELD | Admitting: Family Medicine

## 2020-04-03 VITALS — BP 126/64 | HR 77 | Temp 97.9°F | Wt 157.4 lb

## 2020-04-03 DIAGNOSIS — M79604 Pain in right leg: Secondary | ICD-10-CM | POA: Diagnosis not present

## 2020-04-03 DIAGNOSIS — M810 Age-related osteoporosis without current pathological fracture: Secondary | ICD-10-CM

## 2020-04-03 DIAGNOSIS — M79661 Pain in right lower leg: Secondary | ICD-10-CM | POA: Diagnosis not present

## 2020-04-03 NOTE — Patient Instructions (Signed)
Go for X-ray at [facility] (basement)    I will set up neurology appt.

## 2020-04-03 NOTE — Progress Notes (Signed)
Established Patient Office Visit  Subjective:  Patient ID: Abigail Hayes, female    DOB: 1960/10/03  Age: 59 y.o. MRN: [id number]  CC:  Chief Complaint  Patient presents with   Leg Pain    right leg pain on and off for years thinks its a nerve    Ankle Pain    right ankle pain for about a week     HPI Abigail Hayes presents for evaluation of right leg pain for the past several weeks.  She actually describes some pains intermittently from her thigh all the way down toward her ankle.  This is sometimes described as sharp and can be very fleeting and intermittent.  She has had some low back difficulties in the past.  She has seen Dr. Rolena Infante with [facility] and apparently back in 2018 had MRI with some possible nerve impingement and eventually received epidural.  Her back pain eventually improved.  She denies any low back pain currently.  She has intermittent episodes where she feels weak in her right lower extremity.  She was recently diagnosed per her rheumatologist with osteoporosis and is on alendronate.  She has been doing more weightbearing exercise but has had some increased pain in her right lower tibia region.  This is very intermittent.  She was concerned about possible stress fracture.  She does not have consistent pain with weightbearing.  She sometimes take ibuprofen that helps.  She has frequent "tingling "sensation right lower extremity which is somewhat intermittent.  No loss of urine or stool control  She is followed by Dr. Amil Amen for psoriatic arthritis.  Past Medical History:  Diagnosis Date   Chicken pox    Enlargement of lymph nodes 07/17/2009   PSORIATIC ARTHRITIS 07/17/2009    Past Surgical History:  Procedure Laterality Date   ANTERIOR CRUCIATE LIGAMENT REPAIR  2006    Family History  Problem Relation Age of Onset   Hyperlipidemia Other    Hypertension Other    Cancer Other        ovarian, throat   Heart disease Other    Cancer Mother  73       endometrial cancer   Cancer Father        ?tonsillar cancer    Social History   Socioeconomic History   Marital status: Married    Spouse name: Not on file   Number of children: 3   Years of education: Not on file   Highest education level: Not on file  Occupational History   Occupation: Copywriter, advertising: [employer]  Tobacco Use   Smoking status: Never Smoker   Smokeless tobacco: Never Used  Substance and Sexual Activity   Alcohol use: Yes   Drug use: No   Sexual activity: Not on file  Other Topics Concern   Not on file  Social History Narrative   Not on file   Social Determinants of Health   Financial Resource Strain:    Difficulty of Paying Living Expenses:   Food Insecurity:    Worried About Charity fundraiser in the Last Year:    Arboriculturist in the Last Year:   Transportation Needs:    Film/video editor (Medical):    Lack of Transportation (Non-Medical):   Physical Activity:    Days of Exercise per Week:    Minutes of Exercise per Session:   Stress:    Feeling of Stress :   Social Connections:    Frequency  of Communication with Friends and Family:    Frequency of Social Gatherings with Friends and Family:    Attends Religious Services:    Active Member of Clubs or Organizations:    Attends Music therapist:    Marital Status:   Intimate Partner Violence:    Fear of Current or Ex-Partner:    Emotionally Abused:    Physically Abused:    Sexually Abused:     Outpatient Medications Prior to Visit  Medication Sig Dispense Refill   ibuprofen (ADVIL,MOTRIN) 200 MG tablet Take 200 mg by mouth every 6 (six) hours as needed for pain.     Secukinumab (COSENTYX SENSOREADY 300 DOSE) 150 MG/ML SOAJ Inject into the skin.     Vitamin D, Ergocalciferol, (DRISDOL) 1.25 MG (50000 UT) CAPS capsule TAKE 1 CAPSULE (50,000 UNITS TOTAL) BY MOUTH EVERY 7 (SEVEN) DAYS. 12 capsule 0   zolpidem (AMBIEN) 5 MG  tablet Take 1 tablet (5 mg total) by mouth at bedtime as needed for sleep. 15 tablet 0   No facility-administered medications prior to visit.    Allergies  Allergen Reactions   Other     Red meat    ROS Review of Systems  Constitutional: Negative for fatigue.  Eyes: Negative for visual disturbance.  Respiratory: Negative for cough, chest tightness, shortness of breath and wheezing.   Cardiovascular: Negative for chest pain, palpitations and leg swelling.  Genitourinary: Negative for dysuria.  Musculoskeletal: Positive for arthralgias.  Neurological: Negative for dizziness, seizures, syncope, weakness, light-headedness and headaches.      Objective:    Physical Exam Vitals reviewed.  Constitutional:      Appearance: Normal appearance.  Cardiovascular:     Rate and Rhythm: Normal rate and regular rhythm.  Pulmonary:     Effort: Pulmonary effort is normal.     Breath sounds: Normal breath sounds.  Musculoskeletal:     Comments: Straight leg raises are negative bilaterally.  She has good range of motion right hip with no reproducible pain  Neurological:     Mental Status: She is alert.     Comments: 2+ reflexes knee and ankle bilaterally.  She has full strength with plantarflexion, dorsiflexion, and knee extension bilaterally     BP 126/64 (BP Location: Left Arm, Patient Position: Sitting, Cuff Size: Normal)    Pulse 77    Temp 97.9 F (36.6 C) (Oral)    Wt 157 lb 6.4 oz (71.4 kg)    LMP 06/03/2011    SpO2 97%    BMI 27.02 kg/m  Wt Readings from Last 3 Encounters:  04/03/20 157 lb 6.4 oz (71.4 kg)  06/09/18 154 lb (69.9 kg)  04/26/18 154 lb (69.9 kg)     Health Maintenance Due  Topic Date Due   Hepatitis C Screening  Never done   HIV Screening  Never done   PAP SMEAR-Modifier  11/02/2016    There are no preventive care reminders to display for this patient.  Lab Results  Component Value Date   TSH 1.78 11/01/2013   Lab Results  Component Value Date    WBC 4.1 08/31/2018   HGB 14.8 08/31/2018   HCT 45 08/31/2018   MCV 99.1 11/01/2013   PLT 372 08/31/2018   Lab Results  Component Value Date   NA 142 08/31/2018   K 5.1 08/31/2018   CO2 27 11/01/2013   GLUCOSE 90 11/01/2013   BUN 14 08/31/2018   CREATININE 0.8 08/31/2018   BILITOT 0.7 11/01/2013  ALKPHOS 94 08/31/2018   AST 17 08/31/2018   ALT 17 08/31/2018   PROT 7.9 11/01/2013   ALBUMIN 4.0 11/01/2013   CALCIUM 9.4 11/01/2013   GFR 97.67 11/01/2013   Lab Results  Component Value Date   CHOL 219 (H) 11/01/2013   Lab Results  Component Value Date   HDL 100.40 11/01/2013   No results found for: [facility] Lab Results  Component Value Date   TRIG 78.0 11/01/2013   Lab Results  Component Value Date   CHOLHDL 2 11/01/2013   No results found for: HGBA1C    Assessment & Plan:   Problem List Items Addressed This Visit    None    Visit Diagnoses    Right leg pain    -  Primary   Relevant Orders   DG Tibia/Fibula Right   Ambulatory referral to Neurology    Patient presents with intermittent right lower extremity pain for several months.  Her pain is not severe and disabling but she describes as more of a nuisance.  Suspect this is more neuropathic in nature.  Doubt stress fracture right tibia.  -Obtain right tib-fib films -We discussed possible empiric treatment with gabapentin or Lyrica but she declines at this time for concerns of side effects. -We did discuss possible referral to neurologist to try to further assess and pinpoint nerve involvement.  She Fiorito need some nerve conduction velocities to help further assess and she would like to go that direction first  No orders of the defined types were placed in this encounter.   Follow-up: No follow-ups on file.    Carolann Littler, MD

## 2020-04-04 DIAGNOSIS — M810 Age-related osteoporosis without current pathological fracture: Secondary | ICD-10-CM | POA: Insufficient documentation

## 2020-04-09 ENCOUNTER — Telehealth: Payer: Self-pay | Admitting: Family Medicine

## 2020-04-09 NOTE — Telephone Encounter (Signed)
Pt says, her ankle/leg pain is getting worse and has a specialist appt in October. Pt feels that she can't wait until October and would like to know if she can get an appt to an Orthopedics or have an MRI? Thanks

## 2020-04-09 NOTE — Telephone Encounter (Signed)
Please advise 

## 2020-04-09 NOTE — Telephone Encounter (Signed)
Let's set up orthopedic referral.

## 2020-04-10 ENCOUNTER — Other Ambulatory Visit: Payer: Self-pay | Admitting: *Deleted

## 2020-04-10 DIAGNOSIS — M25571 Pain in right ankle and joints of right foot: Secondary | ICD-10-CM

## 2020-04-10 DIAGNOSIS — M79604 Pain in right leg: Secondary | ICD-10-CM

## 2020-04-10 NOTE — Telephone Encounter (Signed)
Referral for ortho placed. Patient notified and verbalized understanding.

## 2020-04-15 ENCOUNTER — Ambulatory Visit: Payer: Self-pay

## 2020-04-15 ENCOUNTER — Other Ambulatory Visit: Payer: Self-pay | Admitting: Radiology

## 2020-04-15 ENCOUNTER — Ambulatory Visit (INDEPENDENT_AMBULATORY_CARE_PROVIDER_SITE_OTHER): Payer: BLUE CROSS/BLUE SHIELD | Admitting: Orthopaedic Surgery

## 2020-04-15 ENCOUNTER — Ambulatory Visit: Payer: BLUE CROSS/BLUE SHIELD | Admitting: Orthopedic Surgery

## 2020-04-15 DIAGNOSIS — M25571 Pain in right ankle and joints of right foot: Secondary | ICD-10-CM

## 2020-04-15 NOTE — Progress Notes (Signed)
Office Visit Note   Patient: Abigail Hayes           Date of Birth: [birth year]           MRN: [id number] Visit Date: 04/15/2020              Requested by: Eulas Post, MD [place],  [place] [address] PCP: Eulas Post, MD   Assessment & Plan: Visit Diagnoses:  1. Pain in right ankle and joints of right foot     Plan: At this point, I would like to have her wear an ASO during all activities for stability of the ankle.  Given the profound weakness of the posterior tibial tendon, a MRI of the right ankle is warranted to assess for any type of injury or deficit in the tenderness or ligament structures on the medial aspect of her ankle.  We will see her back after this MRI.  All question concerns were answered and addressed.  We did talk about activity modification as well.  Follow-Up Instructions: Return in about 2 weeks (around 04/29/2020).   Orders:  Orders Placed This Encounter  Procedures  . XR Ankle Complete Right   No orders of the defined types were placed in this encounter.     Procedures: No procedures performed   Clinical Data: No additional findings.   Subjective: Chief Complaint  Patient presents with  . Right Ankle - Follow-up  The patient is a very pleasant and active 59 year old female who was going to see Dr. Sharol Given today as a new patient but he is out of the office so I am seeing her.  She does a lot of squats and lunges and she does have a history of psoriatic arthritis.  However about a year and a half ago she did injure her right ankle and feels like this is more of a sprain but now she has a lot of pain and she points to the medial ankle as the source of her pain.  Different activities cause significant pain in that ankle and she has some weakness in it as well.  She denies any other acute changes in her medical status.  She is not a diabetic.  HPI  Review of Systems There is no elicited headache or chest pain.  There is no  fever, chills, nausea, vomiting or shortness of breath.  Objective: Vital Signs: LMP 06/03/2011   Physical Exam She is alert and orient x3 and in no acute distress Ortho Exam On examination of her right ankle, there is pain with inversion of the ankle on the medial aspect.  She cannot do a toe raise on the right side compared to the right side because of pain and weakness.  The range of motion of her ankles stable but she does have a lot of global pain medially. Specialty Comments:  No specialty comments available.  Imaging: XR Ankle Complete Right  Result Date: 04/15/2020 3 views of the right ankle show no acute findings.  There is some small osteophytes medially to suggest an old injury or arthritis versus sprain.    PMFS History: Patient Active Problem List   Diagnosis Date Noted  . Osteoporosis 04/04/2020  . Impingement syndrome of right shoulder 04/26/2018  . 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 CONTUSION OF FOREARM 01/15/2010  . PSORIATIC ARTHRITIS 07/17/2009  . ENLARGEMENT OF LYMPH NODES 07/17/2009   Past Medical History:  Diagnosis Date  . Chicken pox   . Enlargement of lymph  nodes 07/17/2009  . PSORIATIC ARTHRITIS 07/17/2009    Family History  Problem Relation Age of Onset  . Hyperlipidemia Other   . Hypertension Other   . Cancer Other        ovarian, throat  . Heart disease Other   . Cancer Mother 22       endometrial cancer  . Cancer Father        ?tonsillar cancer    Past Surgical History:  Procedure Laterality Date  . ANTERIOR CRUCIATE LIGAMENT REPAIR  2006   Social History   Occupational History  . Occupation: Copywriter, advertising: [employer]  Tobacco Use  . Smoking status: Never Smoker  . Smokeless tobacco: Never Used  Substance and Sexual Activity  . Alcohol use: Yes  . Drug use: No  . Sexual activity: Not on file

## 2020-04-19 DIAGNOSIS — Z79899 Other long term (current) drug therapy: Secondary | ICD-10-CM | POA: Diagnosis not present

## 2020-04-19 DIAGNOSIS — Z111 Encounter for screening for respiratory tuberculosis: Secondary | ICD-10-CM | POA: Diagnosis not present

## 2020-04-19 DIAGNOSIS — M2550 Pain in unspecified joint: Secondary | ICD-10-CM | POA: Diagnosis not present

## 2020-05-06 ENCOUNTER — Ambulatory Visit: Payer: BLUE CROSS/BLUE SHIELD | Admitting: Orthopedic Surgery

## 2020-05-07 ENCOUNTER — Ambulatory Visit
Admission: RE | Admit: 2020-05-07 | Discharge: 2020-05-07 | Disposition: A | Discharge status: Home / Self Care | Payer: BLUE CROSS/BLUE SHIELD | Source: Ambulatory Visit | Attending: Orthopaedic Surgery | Admitting: Orthopaedic Surgery

## 2020-05-07 DIAGNOSIS — M19071 Primary osteoarthritis, right ankle and foot: Secondary | ICD-10-CM | POA: Diagnosis not present

## 2020-05-07 DIAGNOSIS — M25471 Effusion, right ankle: Secondary | ICD-10-CM | POA: Diagnosis not present

## 2020-05-07 DIAGNOSIS — M25571 Pain in right ankle and joints of right foot: Secondary | ICD-10-CM

## 2020-05-07 DIAGNOSIS — S93491A Sprain of other ligament of right ankle, initial encounter: Secondary | ICD-10-CM | POA: Diagnosis not present

## 2020-05-07 DIAGNOSIS — M65871 Other synovitis and tenosynovitis, right ankle and foot: Secondary | ICD-10-CM | POA: Diagnosis not present

## 2020-05-09 ENCOUNTER — Encounter: Payer: Self-pay | Admitting: Orthopedic Surgery

## 2020-05-09 ENCOUNTER — Ambulatory Visit (INDEPENDENT_AMBULATORY_CARE_PROVIDER_SITE_OTHER): Payer: BLUE CROSS/BLUE SHIELD | Admitting: Orthopedic Surgery

## 2020-05-09 DIAGNOSIS — M24071 Loose body in right ankle: Secondary | ICD-10-CM

## 2020-05-09 DIAGNOSIS — M25871 Other specified joint disorders, right ankle and foot: Secondary | ICD-10-CM | POA: Diagnosis not present

## 2020-05-09 NOTE — Progress Notes (Signed)
Office Visit Note   Patient: Abigail Hayes           Date of Birth: 05/05/1961           MRN: [id number] Visit Date: 05/09/2020              Requested by: Eulas Post, MD [place],  [place] [address] PCP: Eulas Post, MD  Chief Complaint  Patient presents with  . Right Ankle - Follow-up      HPI: Patient is a 59 year old woman who was seen for initial evaluation and referral from Dr. Ninfa Linden for right ankle pain and swelling.  Patient states she has episodes of sharp pain with giving way.  She states she is trying to be active with exercise weight training and aerobic exercise.  She states that an ankle stabilizing orthosis has not worked she states she takes anti-inflammatories for pain  Patient has a history of psoriatic arthritis she does not use disease modifying drugs.  She states she initially sprained her ankle about a year and a half ago but is having increasing pain over the past several months.  Assessment & Plan: Visit Diagnoses:  1. Loose body in right ankle   2. Impingement syndrome of right ankle     Plan: Discussed with the patient that she has large loose bodies impingement effusion does not appear to have osteochondral defects.  Discussed to relieve her symptoms the best treatment would be arthroscopic intervention for debridement of the impingement removal of the loose bodies and debridement of the anterior spurs.  Risks and benefits of surgery were discussed.  Patient states she understands she states she would like to continue with her activities and will follow-up as needed.  Discussed that there is a potential for increased joint damage from the loose bodies.  Follow-Up Instructions: Return if symptoms worsen or fail to improve.   Ortho Exam  Patient is alert, oriented, no adenopathy, well-dressed, normal affect, normal respiratory effort. Examination patient is a good dorsalis pedis pulse the anterior drawer is equal  bilaterally with no ligamentous instability she has effusion over the anterior lateral joint line is tender to palpation over the entire anterior joint line.  Patient has a history of sciatic pain but there is negative sciatic tension sign.  Review of the MRI scan shows large loose bodies anteriorly with a large effusion there is anterior bony spurs there are no osteochondral defects of the tibia or talus.  Imaging: No results found. No images are attached to the encounter.  Labs: No results found for: HGBA1C, ESRSEDRATE, CRP, LABURIC, REPTSTATUS, GRAMSTAIN, CULT, LABORGA   Lab Results  Component Value Date   ALBUMIN 4.0 11/01/2013   ALBUMIN 3.9 06/22/2011    No results found for: MG Lab Results  Component Value Date   VD25OH 37.4 08/31/2018    No results found for: PREALBUMIN CBC EXTENDED Latest Ref Rng & Units 08/31/2018 11/01/2013 06/22/2011  WBC - 4.1 6.8 5.9  RBC 3.87 - 5.11 Mil/uL - 4.34 3.96  HGB 12.0 - 16.0 14.8 14.3 13.3  HCT 36 - 46 45 43.0 39.6  PLT 150 - 399 372 366.0 384.0  NEUTROABS 1.4 - 7.7 K/uL - 3.7 3.9  LYMPHSABS 0.7 - 4.0 K/uL - 2.4 1.5     There is no height or weight on file to calculate BMI.  Orders:  No orders of the defined types were placed in this encounter.  No orders of the defined types  were placed in this encounter.    Procedures: No procedures performed  Clinical Data: No additional findings.  ROS:  All other systems negative, except as noted in the HPI. Review of Systems  Objective: Vital Signs: LMP 06/03/2011   Specialty Comments:  No specialty comments available.  PMFS History: Patient Active Problem List   Diagnosis Date Noted  . Osteoporosis 04/04/2020  . Impingement syndrome of right shoulder 04/26/2018  . 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 CONTUSION OF FOREARM 01/15/2010  . PSORIATIC ARTHRITIS 07/17/2009  . ENLARGEMENT OF LYMPH NODES 07/17/2009   Past Medical History:  Diagnosis Date  . Chicken pox   .  Enlargement of lymph nodes 07/17/2009  . PSORIATIC ARTHRITIS 07/17/2009    Family History  Problem Relation Age of Onset  . Hyperlipidemia Other   . Hypertension Other   . Cancer Other        ovarian, throat  . Heart disease Other   . Cancer Mother 12       endometrial cancer  . Cancer Father        ?tonsillar cancer    Past Surgical History:  Procedure Laterality Date  . ANTERIOR CRUCIATE LIGAMENT REPAIR  2006   Social History   Occupational History  . Occupation: Copywriter, advertising: [employer]  Tobacco Use  . Smoking status: Never Smoker  . Smokeless tobacco: Never Used  Substance and Sexual Activity  . Alcohol use: Yes  . Drug use: No  . Sexual activity: Not on file

## 2020-06-11 ENCOUNTER — Other Ambulatory Visit: Payer: Self-pay

## 2020-06-12 ENCOUNTER — Encounter: Payer: Self-pay | Admitting: Family Medicine

## 2020-06-12 ENCOUNTER — Ambulatory Visit (INDEPENDENT_AMBULATORY_CARE_PROVIDER_SITE_OTHER): Payer: BLUE CROSS/BLUE SHIELD | Admitting: Family Medicine

## 2020-06-12 VITALS — BP 128/86 | HR 60 | Temp 97.6°F | Ht 63.5 in | Wt 154.3 lb

## 2020-06-12 DIAGNOSIS — Z0000 Encounter for general adult medical examination without abnormal findings: Secondary | ICD-10-CM

## 2020-06-12 DIAGNOSIS — Z23 Encounter for immunization: Secondary | ICD-10-CM

## 2020-06-12 DIAGNOSIS — Z91018 Allergy to other foods: Secondary | ICD-10-CM | POA: Insufficient documentation

## 2020-06-12 MED ORDER — EPINEPHRINE 0.3 MG/0.3ML IJ SOAJ: INTRAMUSCULAR | 1 refills | Status: DC

## 2020-06-12 NOTE — Progress Notes (Signed)
Established Patient Office Visit  Subjective:  Patient ID: Abigail Hayes, female    DOB: May 10, 1961  Age: 59 y.o. MRN: [id number]  CC:  Chief Complaint  Patient presents with  . Annual Exam    Doing well    HPI Brigitte Soderberg Hosking presents for physical exam.  She has history of psoriatic arthritis, alpha gal allergy, osteoporosis.  She is followed by rheumatology and also sees GYN yearly.  She has scheduled follow-up with her GYN soon and plans to get repeat bone density, Pap smear, and mammogram with them.  She has history of alpha gal allergy.  Requesting refill EpiPen.  She is also requesting alpha gal antibodies.  She is diligent to avoid red meat.  No recent anaphylaxis symptoms.  She had recent lab work with CBC and comprehensive metabolic panel per rheumatology and she brings in those results and those are reviewed with no major concerns  Family history reviewed.  Mom had breast cancer and endometrial cancer.  Father had squamous cell cancer of the esophagus  Social history-she is married.  Her husband is scheduled for surgery for removal of tubular adenoma the colon next week.  Caila is non-smoker.  No regular alcohol use.  Health maintenance reviewed  -No prior hepatitis C screening but low risk -Pap smear scheduled for next week -Needs flu vaccine -Covid vaccines already given -Due for repeat colonoscopy this November -No history of shingles vaccine -GYN studies with Pap smear mammogram as above  Past Medical History:  Diagnosis Date  . Chicken pox   . Enlargement of lymph nodes 07/17/2009  . PSORIATIC ARTHRITIS 07/17/2009    Past Surgical History:  Procedure Laterality Date  . ANTERIOR CRUCIATE LIGAMENT REPAIR  2006    Family History  Problem Relation Age of Onset  . Hyperlipidemia Other   . Hypertension Other   . Cancer Other        ovarian, throat  . Heart disease Other   . Cancer Mother 71       endometrial cancer  . Cancer Father        ?tonsillar cancer     Social History   Socioeconomic History  . Marital status: Married    Spouse name: Not on file  . Number of children: 3  . Years of education: Not on file  . Highest education level: Not on file  Occupational History  . Occupation: Copywriter, advertising: [employer]  Tobacco Use  . Smoking status: Never Smoker  . Smokeless tobacco: Never Used  Vaping Use  . Vaping Use: Never used  Substance and Sexual Activity  . Alcohol use: Yes  . Drug use: No  . Sexual activity: Not on file  Other Topics Concern  . Not on file  Social History Narrative  . Not on file   Social Determinants of Health   Financial Resource Strain:   . Difficulty of Paying Living Expenses: Not on file  Food Insecurity:   . Worried About Charity fundraiser in the Last Year: Not on file  . Ran Out of Food in the Last Year: Not on file  Transportation Needs:   . Lack of Transportation (Medical): Not on file  . Lack of Transportation (Non-Medical): Not on file  Physical Activity:   . Days of Exercise per Week: Not on file  . Minutes of Exercise per Session: Not on file  Stress:   . Feeling of Stress : Not on file  Social Connections:   .  Frequency of Communication with Friends and Family: Not on file  . Frequency of Social Gatherings with Friends and Family: Not on file  . Attends Religious Services: Not on file  . Active Member of Clubs or Organizations: Not on file  . Attends Archivist Meetings: Not on file  . Marital Status: Not on file  Intimate Partner Violence:   . Fear of Current or Ex-Partner: Not on file  . Emotionally Abused: Not on file  . Physically Abused: Not on file  . Sexually Abused: Not on file    Outpatient Medications Prior to Visit  Medication Sig Dispense Refill  . alendronate (FOSAMAX) 70 MG tablet Take 70 mg by mouth once a week.    Marland Kitchen ibuprofen (ADVIL,MOTRIN) 200 MG tablet Take 200 mg by mouth every 6 (six) hours as needed for pain.    . Secukinumab (COSENTYX  SENSOREADY 300 DOSE) 150 MG/ML SOAJ Inject into the skin.    Marland Kitchen zolpidem (AMBIEN) 5 MG tablet Take 1 tablet (5 mg total) by mouth at bedtime as needed for sleep. 15 tablet 0  . EPINEPHrine (EPIPEN 2-PAK) 0.3 mg/0.3 mL IJ SOAJ injection EpiPen 2-Pak 0.3 mg/0.3 mL injection, auto-injector    . Vitamin D, Ergocalciferol, (DRISDOL) 1.25 MG (50000 UT) CAPS capsule TAKE 1 CAPSULE (50,000 UNITS TOTAL) BY MOUTH EVERY 7 (SEVEN) DAYS. (Patient not taking: Reported on 06/12/2020) 12 capsule 0   No facility-administered medications prior to visit.    Allergies  Allergen Reactions  . Other     Red meat    ROS Review of Systems  Constitutional: Negative for activity change, appetite change, fatigue, fever and unexpected weight change.  HENT: Negative for ear pain, hearing loss, sore throat and trouble swallowing.   Eyes: Negative for visual disturbance.  Respiratory: Negative for cough and shortness of breath.   Cardiovascular: Negative for chest pain and palpitations.  Gastrointestinal: Negative for abdominal pain, blood in stool, constipation and diarrhea.  Genitourinary: Negative for dysuria and hematuria.  Musculoskeletal: Positive for arthralgias. Negative for back pain and myalgias.  Skin: Negative for rash.  Neurological: Negative for dizziness, syncope and headaches.  Hematological: Negative for adenopathy.  Psychiatric/Behavioral: Negative for confusion and dysphoric mood.      Objective:    Physical Exam Vitals reviewed.  Constitutional:      Appearance: Normal appearance.  HENT:     Right Ear: Tympanic membrane normal.     Left Ear: Tympanic membrane normal.  Cardiovascular:     Rate and Rhythm: Normal rate and regular rhythm.     Heart sounds: No murmur heard.  No gallop.   Pulmonary:     Effort: Pulmonary effort is normal.     Breath sounds: Normal breath sounds.  Abdominal:     Palpations: Abdomen is soft.     Tenderness: There is no abdominal tenderness.    Musculoskeletal:     Cervical back: Neck supple.     Right lower leg: No edema.     Left lower leg: No edema.  Lymphadenopathy:     Cervical: No cervical adenopathy.  Neurological:     General: No focal deficit present.     Mental Status: She is alert.     Cranial Nerves: No cranial nerve deficit.  Psychiatric:        Mood and Affect: Mood normal.        Thought Content: Thought content normal.     BP 128/86   Pulse 60   Temp 97.6  F (36.4 C) (Oral)   Ht 5' 3.5" (1.613 m)   Wt 154 lb 4.8 oz (70 kg)   LMP 06/03/2011   SpO2 97%   BMI 26.90 kg/m  Wt Readings from Last 3 Encounters:  06/12/20 154 lb 4.8 oz (70 kg)  04/03/20 157 lb 6.4 oz (71.4 kg)  06/09/18 154 lb (69.9 kg)     Health Maintenance Due  Topic Date Due  . Hepatitis C Screening  Never done  . HIV Screening  Never done  . PAP SMEAR-Modifier  11/02/2016  . INFLUENZA VACCINE  04/14/2020    There are no preventive care reminders to display for this patient.  Lab Results  Component Value Date   TSH 1.78 11/01/2013   Lab Results  Component Value Date   WBC 4.1 08/31/2018   HGB 14.8 08/31/2018   HCT 45 08/31/2018   MCV 99.1 11/01/2013   PLT 372 08/31/2018   Lab Results  Component Value Date   NA 142 08/31/2018   K 5.1 08/31/2018   CO2 27 11/01/2013   GLUCOSE 90 11/01/2013   BUN 14 08/31/2018   CREATININE 0.8 08/31/2018   BILITOT 0.7 11/01/2013   ALKPHOS 94 08/31/2018   AST 17 08/31/2018   ALT 17 08/31/2018   PROT 7.9 11/01/2013   ALBUMIN 4.0 11/01/2013   CALCIUM 9.4 11/01/2013   GFR 97.67 11/01/2013   Lab Results  Component Value Date   CHOL 219 (H) 11/01/2013   Lab Results  Component Value Date   HDL 100.40 11/01/2013   No results found for: [facility] Lab Results  Component Value Date   TRIG 78.0 11/01/2013   Lab Results  Component Value Date   CHOLHDL 2 11/01/2013   No results found for: HGBA1C    Assessment & Plan:   Problem List Items Addressed This Visit       Unprioritized   Allergy to alpha-gal   Relevant Orders   Alpha Gal IgE    Other Visit Diagnoses    Physical exam    -  Primary   Relevant Orders   Hep C Antibody   Lipid panel   TSH   Ambulatory referral to Gastroenterology    We discussed several health maintenance issues as below  -She is scheduled to see GYN next week and plans to get Pap smear, DEXA scan, and mammogram through them -We discussed Shingrix vaccine and she will check on insurance coverage -Flu vaccine given -Check lipids and TSH along with hepatitis C antibody.  We did not obtain CBC or comprehensive chemistries as these were done recently through rheumatology -Refill EpiPen given -Set up referral back to GI for repeat colonoscopy -Patient requesting alpha gal IgE E levels and this was obtained.  Meds ordered this encounter  Medications  . EPINEPHrine (EPIPEN 2-PAK) 0.3 mg/0.3 mL IJ SOAJ injection    Sig: EpiPen 2-Pak 0.3 mg/0.3 mL injection, auto-injector    Dispense:  1 each    Refill:  1    Follow-up: No follow-ups on file.    Carolann Littler, MD

## 2020-06-12 NOTE — Addendum Note (Signed)
Addended by: Marrion Coy on: 06/12/2020 08:31 AM   Modules accepted: Orders

## 2020-06-12 NOTE — Addendum Note (Signed)
Addended by: Anibal Henderson on: 06/12/2020 08:42 AM   Modules accepted: Orders

## 2020-06-12 NOTE — Addendum Note (Signed)
Addended by: Marrion Coy on: 06/12/2020 08:30 AM   Modules accepted: Orders

## 2020-06-12 NOTE — Patient Instructions (Signed)
Preventive Care 40-60 Years Old, Female Preventive care refers to visits with your health care provider and lifestyle choices that can promote health and wellness. This includes:  A yearly physical exam. This Wilmarth also be called an annual well check.  Regular dental visits and eye exams.  Immunizations.  Screening for certain conditions.  Healthy lifestyle choices, such as eating a healthy diet, getting regular exercise, not using drugs or products that contain nicotine and tobacco, and limiting alcohol use. What can I expect for my preventive care visit? Physical exam Your health care provider will check your:  Height and weight. This Scarbro be used to calculate body mass index (BMI), which tells if you are at a healthy weight.  Heart rate and blood pressure.  Skin for abnormal spots. Counseling Your health care provider Osorto ask you questions about your:  Alcohol, tobacco, and drug use.  Emotional well-being.  Home and relationship well-being.  Sexual activity.  Eating habits.  Work and work environment.  Method of birth control.  Menstrual cycle.  Pregnancy history. What immunizations do I need?  Influenza (flu) vaccine  This is recommended every year. Tetanus, diphtheria, and pertussis (Tdap) vaccine  You Fandino need a Td booster every 10 years. Varicella (chickenpox) vaccine  You Zachery need this if you have not been vaccinated. Zoster (shingles) vaccine  You Jacome need this after age 60. Measles, mumps, and rubella (MMR) vaccine  You Racanelli need at least one dose of MMR if you were born in 1957 or later. You Berkowitz also need a second dose. Pneumococcal conjugate (PCV13) vaccine  You Courville need this if you have certain conditions and were not previously vaccinated. Pneumococcal polysaccharide (PPSV23) vaccine  You Bega need one or two doses if you smoke cigarettes or if you have certain conditions. Meningococcal conjugate (MenACWY) vaccine  You Gropp need this if you  have certain conditions. Hepatitis A vaccine  You Mcbrien need this if you have certain conditions or if you travel or work in places where you Ackerman be exposed to hepatitis A. Hepatitis B vaccine  You Mcvay need this if you have certain conditions or if you travel or work in places where you Bastin be exposed to hepatitis B. Haemophilus influenzae type b (Hib) vaccine  You Corle need this if you have certain conditions. Human papillomavirus (HPV) vaccine  If recommended by your health care provider, you Erdmann need three doses over 6 months. You Test receive vaccines as individual doses or as more than one vaccine together in one shot (combination vaccines). Talk with your health care provider about the risks and benefits of combination vaccines. What tests do I need? Blood tests  Lipid and cholesterol levels. These Viles be checked every 5 years, or more frequently if you are over 50 years old.  Hepatitis C test.  Hepatitis B test. Screening  Lung cancer screening. You Irion have this screening every year starting at age 55 if you have a 30-pack-year history of smoking and currently smoke or have quit within the past 15 years.  Colorectal cancer screening. All adults should have this screening starting at age 50 and continuing until age 75. Your health care provider Thorson recommend screening at age 45 if you are at increased risk. You will have tests every 1-10 years, depending on your results and the type of screening test.  Diabetes screening. This is done by checking your blood sugar (glucose) after you have not eaten for a while (fasting). You Hodgkin have this   done every 1-3 years.  Mammogram. This Obeirne be done every 1-2 years. Talk with your health care provider about when you should start having regular mammograms. This Brasington depend on whether you have a family history of breast cancer.  BRCA-related cancer screening. This Stucky be done if you have a family history of breast, ovarian, tubal, or peritoneal  cancers.  Pelvic exam and Pap test. This Cornell be done every 3 years starting at age 66. Starting at age 81, this Paulhus be done every 5 years if you have a Pap test in combination with an HPV test. Other tests  Sexually transmitted disease (STD) testing.  Bone density scan. This is done to screen for osteoporosis. You Lamica have this scan if you are at high risk for osteoporosis. Follow these instructions at home: Eating and drinking  Eat a diet that includes fresh fruits and vegetables, whole grains, lean protein, and low-fat dairy.  Take vitamin and mineral supplements as recommended by your health care provider.  Do not drink alcohol if: ? Your health care provider tells you not to drink. ? You are pregnant, Crothers be pregnant, or are planning to become pregnant.  If you drink alcohol: ? Limit how much you have to 0-1 drink a day. ? Be aware of how much alcohol is in your drink. In the [place] one drink equals one 12 oz bottle of beer (355 mL), one 5 oz glass of wine (148 mL), or one 1 oz glass of hard liquor (44 mL). Lifestyle  Take daily care of your teeth and gums.  Stay active. Exercise for at least 30 minutes on 5 or more days each week.  Do not use any products that contain nicotine or tobacco, such as cigarettes, e-cigarettes, and chewing tobacco. If you need help quitting, ask your health care provider.  If you are sexually active, practice safe sex. Use a condom or other form of birth control (contraception) in order to prevent pregnancy and STIs (sexually transmitted infections).  If told by your health care provider, take low-dose aspirin daily starting at age 62. What's next?  Visit your health care provider once a year for a well check visit.  Ask your health care provider how often you should have your eyes and teeth checked.  Stay up to date on all vaccines. This information is not intended to replace advice given to you by your health care provider. Make sure you  discuss any questions you have with your health care provider. Document Revised: 05/12/2018 Document Reviewed: 05/12/2018 Elsevier Patient Education  2020 [employer].

## 2020-06-13 ENCOUNTER — Other Ambulatory Visit: Payer: Self-pay | Admitting: Obstetrics and Gynecology

## 2020-06-13 DIAGNOSIS — Z1231 Encounter for screening mammogram for malignant neoplasm of breast: Secondary | ICD-10-CM

## 2020-06-13 LAB — LIPID PANEL
Cholesterol: 241 mg/dL — ABNORMAL HIGH (ref <200)
HDL: 77 mg/dL (ref >=50)
LDL Cholesterol (Calc): 142 mg/dL (calc) — ABNORMAL HIGH
Non-HDL Cholesterol (Calc): 164 mg/dL (calc) — ABNORMAL HIGH (ref <130)
Total CHOL/HDL Ratio: 3.1 (calc) (ref <5.0)
Triglycerides: 105 mg/dL (ref <150)

## 2020-06-13 LAB — HEPATITIS C ANTIBODY
Hepatitis C Ab: NONREACTIVE
SIGNAL TO CUT-OFF: 0.01 (ref <1.00)

## 2020-06-13 LAB — TSH: TSH: 2.56 mIU/L (ref 0.40–4.50)

## 2020-06-14 ENCOUNTER — Other Ambulatory Visit: Payer: Self-pay

## 2020-06-14 ENCOUNTER — Ambulatory Visit (INDEPENDENT_AMBULATORY_CARE_PROVIDER_SITE_OTHER): Payer: BLUE CROSS/BLUE SHIELD | Admitting: Neurology

## 2020-06-14 ENCOUNTER — Encounter: Payer: Self-pay | Admitting: Neurology

## 2020-06-14 VITALS — BP 138/75 | HR 74 | Resp 18 | Ht 64.0 in | Wt 154.0 lb

## 2020-06-14 DIAGNOSIS — M4306 Spondylolysis, lumbar region: Secondary | ICD-10-CM

## 2020-06-14 DIAGNOSIS — M5417 Radiculopathy, lumbosacral region: Secondary | ICD-10-CM | POA: Insufficient documentation

## 2020-06-14 NOTE — Progress Notes (Signed)
Abigail Hayes   Date: 06/14/20  Abigail Hayes MRN: [id number] DOB: 09/17/1960   Dear Dr. Elease Hashimoto:  Thank you for your kind referral of Abigail Hayes for consultation of right leg pain. Although her history is well known to you, please allow [place] to reiterate it for the purpose of our medical record. The patient was accompanied to the clinic by self.    History of Present Illness: Abigail Hayes is a 59 y.o. right-handed female presenting for evaluation of right leg pain.  She has about 4-5 year history of low back pain radiating into the right leg.  She has received ESI which helped low back pain, but continues to have spells of buzzing sensation down the right leg about once per month, triggered by prolonged sitting.  She has seen [facility] Rolena Infante at [facility] and had MRI lumbar spine in 2019, per patient, this  showed degenerative changes with spondylolisthesis, disc extrusion, and arthritic changes.  MRI report is not available to me. MRI from 2007 shows small annular tear at L4-5 and mild lateral recess narrowing at the right without nerve impingement.    Out-side paper records, electronic medical record, and images have been reviewed where available and summarized as:  No results found for: HGBA1C No results found for: VITAMINB12 Lab Results  Component Value Date   TSH 2.56 06/12/2020   MRI lumbar spine 10/21/2005: Mild degenerative changes. Small annular tear on the right at L4-5 is present. There Howse be some mild narrowing of the lateral recess on the right at this level. No definite neural impingement.   Past Medical History:  Diagnosis Date  . Chicken pox   . Enlargement of lymph nodes 07/17/2009  . PSORIATIC ARTHRITIS 07/17/2009    Past Surgical History:  Procedure Laterality Date  . ANTERIOR CRUCIATE LIGAMENT REPAIR  2006     Medications:  Outpatient Encounter Medications as of 06/14/2020  Medication Sig  .  alendronate (FOSAMAX) 70 MG tablet Take 70 mg by mouth once a week.  Marland Kitchen EPINEPHrine (EPIPEN 2-PAK) 0.3 mg/0.3 mL IJ SOAJ injection EpiPen 2-Pak 0.3 mg/0.3 mL injection, auto-injector  . ibuprofen (ADVIL,MOTRIN) 200 MG tablet Take 200 mg by mouth every 6 (six) hours as needed for pain.  . Secukinumab (COSENTYX SENSOREADY 300 DOSE) 150 MG/ML SOAJ Inject into the skin.  Marland Kitchen zolpidem (AMBIEN) 5 MG tablet Take 1 tablet (5 mg total) by mouth at bedtime as needed for sleep.  . Vitamin D, Ergocalciferol, (DRISDOL) 1.25 MG (50000 UT) CAPS capsule TAKE 1 CAPSULE (50,000 UNITS TOTAL) BY MOUTH EVERY 7 (SEVEN) DAYS. (Patient not taking: Reported on 06/14/2020)   No facility-administered encounter medications on file as of 06/14/2020.    Allergies:  Allergies  Allergen Reactions  . Other     Red meat    Family History: Family History  Problem Relation Age of Onset  . Hyperlipidemia Other   . Hypertension Other   . Cancer Other        ovarian, throat  . Heart disease Other   . Cancer Mother 67       endometrial cancer  . Cancer Father        ?tonsillar cancer    Social History: Social History   Tobacco Use  . Smoking status: Never Smoker  . Smokeless tobacco: Never Used  Vaping Use  . Vaping Use: Never used  Substance Use Topics  . Alcohol use: Yes  . Drug use: No  Social History   Social History Narrative   Right handed   Two story home   Drinks caffeine    Vital Signs:  BP 138/75   Pulse 74   Resp 18   Ht 5\' 4"  (1.626 m)   Wt 154 lb (69.9 kg)   LMP 06/03/2011   SpO2 100%   BMI 26.43 kg/m    Neurological Exam: MENTAL STATUS including orientation to time, place, person, recent and remote memory, attention span and concentration, language, and fund of knowledge is normal.  Speech is not dysarthric.  CRANIAL NERVES: II:  No visual field defects.    III-IV-VI: Pupils equal round and reactive to light.  Normal conjugate, extra-ocular eye movements in all directions of  gaze.  No nystagmus.  No ptosis.   V:  Normal facial sensation.    VII:  Normal facial symmetry and movements.   VIII:  Normal hearing and vestibular function.   IX-X:  Normal palatal movement.   XI:  Normal shoulder shrug and head rotation.   XII:  Normal tongue strength and range of motion, no deviation or fasciculation.  MOTOR:  No atrophy, fasciculations or abnormal movements.  No pronator drift.   Upper Extremity:  Right  Left  Deltoid  5/5   5/5   Biceps  5/5   5/5   Triceps  5/5   5/5   Infraspinatus 5/5  5/5  Medial pectoralis 5/5  5/5  Wrist extensors  5/5   5/5   Wrist flexors  5/5   5/5   Finger extensors  5/5   5/5   Finger flexors  5/5   5/5   Dorsal interossei  5/5   5/5   Abductor pollicis  5/5   5/5   Tone (Ashworth scale)  0  0   Lower Extremity:  Right  Left  Hip flexors  5/5   5/5   Hip extensors  5/5   5/5   Adductor 5/5  5/5  Abductor 5/5  5/5  Knee flexors  5/5   5/5   Knee extensors  5/5   5/5   Dorsiflexors  5/5   5/5   Plantarflexors  5/5   5/5   Toe extensors  5/5   5/5   Toe flexors  5/5   5/5   Tone (Ashworth scale)  0  0   MSRs:  Right        Left                  brachioradialis 2+  2+  biceps 2+  2+  triceps 2+  2+  patellar 2+  2+  ankle jerk 2+  2+  Hoffman no  no  plantar response down  down   SENSORY:  Normal and symmetric perception of light touch, pinprick, vibration, and proprioception.    COORDINATION/GAIT: Normal finger-to- nose-finger and heel-to-shin.  Intact rapid alternating movements bilaterally.  Able to rise from a chair without using arms.  Gait narrow based and stable. Tandem and stressed gait intact.    IMPRESSION: Lumbar spondylosis with intermittent spells of lumbosacral radiculopathy I explained that it is natural to have episodic flares of pain related to lumbosacral disease and incorporating daily stretching and low back strengthening regimen will help minimize the frequency of this.  At this time, she is  asymptomatic and therefore no additional testing is indicated.   Return to clinic as needed   Thank you for allowing me to participate in patient's  care.  If I can answer any additional questions, I would be pleased to do so.    Sincerely,    Erric Machnik K. Posey Pronto, DO

## 2020-06-14 NOTE — Patient Instructions (Signed)
Try to start daily stretches and low back exercises

## 2020-06-17 ENCOUNTER — Telehealth: Payer: Self-pay | Admitting: Orthopedic Surgery

## 2020-06-17 ENCOUNTER — Encounter: Payer: Self-pay | Admitting: Orthopedic Surgery

## 2020-06-17 ENCOUNTER — Telehealth: Payer: Self-pay | Admitting: Family Medicine

## 2020-06-17 NOTE — Telephone Encounter (Signed)
Please see message. °

## 2020-06-17 NOTE — Telephone Encounter (Signed)
Pt called stating she had a question about her MRI results and would like a CB to discuss further  [phone]

## 2020-06-17 NOTE — Telephone Encounter (Signed)
Spoke with patient.  Reviewed MRI.  Tried by best to answer her questions-but have recommended that she follow-up with her orthopedist to go over details.

## 2020-06-17 NOTE — Telephone Encounter (Signed)
Pt is calling in to see if Dr. Elease Hashimoto can explain to her her MRI from 05/07/2020 she stated that Dr. Sharol Given did not say anything about having a tear in her tendon and wanted to know if it should be something that she should be really concerned about or if she should call Dr. Sharol Given for a clearer explanation since reading it on her mychart.  Pt stated that she will back Dr. Sharol Given if she has not heard anything from [place] and if she does she will call the office back and cancel this msg.

## 2020-06-18 ENCOUNTER — Telehealth: Payer: Self-pay | Admitting: Orthopedic Surgery

## 2020-06-18 ENCOUNTER — Encounter: Payer: Self-pay | Admitting: Gastroenterology

## 2020-06-18 ENCOUNTER — Encounter: Payer: Self-pay | Admitting: Family Medicine

## 2020-06-18 LAB — ALPHA-GAL PANEL
Beef IgE: 13.2 kU/L — ABNORMAL HIGH (ref <0.35)
Class: 2
Class: 3
Class: 3
Galactose-alpha-1,3-galactose IgE: 27.8 kU/L — ABNORMAL HIGH (ref <0.10)
LAMB/MUTTON IGE: 2.42 kU/L — ABNORMAL HIGH (ref <0.35)
Pork IgE: 5.11 kU/L — ABNORMAL HIGH (ref <0.35)

## 2020-06-18 LAB — SPECIMEN COMPROMISED

## 2020-06-18 NOTE — Telephone Encounter (Signed)
Dr. Sharol Given called and discussed results with pt.

## 2020-06-18 NOTE — Telephone Encounter (Signed)
I spoke with patient about her MRI scan findings.  Discussed that while she has had multiple sprains of her anterior talofibular ligament that shows up on the MRI scan wound we performed the anterior drawer both ankles were equal so there was no laxity of the anterior tibial tendon on the affected ankle.  I have recommended fascial strengthening continue with conservative treatment and if she fails conservative treatment we could proceed with arthroscopic intervention.  I encouraged patient to call if she had further questions.

## 2020-06-19 DIAGNOSIS — Z13820 Encounter for screening for osteoporosis: Secondary | ICD-10-CM | POA: Diagnosis not present

## 2020-06-19 DIAGNOSIS — Z01419 Encounter for gynecological examination (general) (routine) without abnormal findings: Secondary | ICD-10-CM | POA: Diagnosis not present

## 2020-06-19 DIAGNOSIS — M810 Age-related osteoporosis without current pathological fracture: Secondary | ICD-10-CM

## 2020-06-19 DIAGNOSIS — E8809 Other disorders of plasma-protein metabolism, not elsewhere classified: Secondary | ICD-10-CM

## 2020-06-19 DIAGNOSIS — Z6827 Body mass index (BMI) 27.0-27.9, adult: Secondary | ICD-10-CM | POA: Diagnosis not present

## 2020-06-19 DIAGNOSIS — E756 Lipid storage disorder, unspecified: Secondary | ICD-10-CM

## 2020-06-19 HISTORY — DX: Other disorders of plasma-protein metabolism, not elsewhere classified: E88.09

## 2020-06-19 HISTORY — DX: Lipid storage disorder, unspecified: E75.6

## 2020-06-19 HISTORY — DX: Age-related osteoporosis without current pathological fracture: M81.0

## 2020-07-08 ENCOUNTER — Ambulatory Visit
Admission: RE | Admit: 2020-07-08 | Discharge: 2020-07-08 | Disposition: A | Discharge status: Home / Self Care | Payer: BLUE CROSS/BLUE SHIELD | Source: Ambulatory Visit | Attending: Obstetrics and Gynecology | Admitting: Obstetrics and Gynecology

## 2020-07-08 ENCOUNTER — Other Ambulatory Visit: Payer: Self-pay

## 2020-07-08 DIAGNOSIS — Z1231 Encounter for screening mammogram for malignant neoplasm of breast: Secondary | ICD-10-CM

## 2020-07-19 ENCOUNTER — Encounter: Payer: Self-pay | Admitting: Gastroenterology

## 2020-07-19 ENCOUNTER — Other Ambulatory Visit: Payer: Self-pay

## 2020-07-19 ENCOUNTER — Ambulatory Visit (AMBULATORY_SURGERY_CENTER): Payer: Self-pay

## 2020-07-19 VITALS — Ht 64.0 in | Wt 160.0 lb

## 2020-07-19 DIAGNOSIS — Z1211 Encounter for screening for malignant neoplasm of colon: Secondary | ICD-10-CM

## 2020-07-19 NOTE — Progress Notes (Signed)
No allergies to soy or egg Pt is not on blood thinners or diet pills Denies issues with sedation/intubation Denies atrial flutter/fib Denies constipation   Emmi instructions given to pt  Pt is aware of Covid safety and care partner requirements. Selmer

## 2020-07-29 ENCOUNTER — Ambulatory Visit (AMBULATORY_SURGERY_CENTER): Payer: BLUE CROSS/BLUE SHIELD | Admitting: Gastroenterology

## 2020-07-29 ENCOUNTER — Encounter: Payer: Self-pay | Admitting: Gastroenterology

## 2020-07-29 ENCOUNTER — Other Ambulatory Visit: Payer: Self-pay

## 2020-07-29 VITALS — BP 125/69 | HR 55 | Temp 97.3°F | Resp 15 | Ht 64.0 in | Wt 160.0 lb

## 2020-07-29 DIAGNOSIS — Z1211 Encounter for screening for malignant neoplasm of colon: Secondary | ICD-10-CM

## 2020-07-29 DIAGNOSIS — D123 Benign neoplasm of transverse colon: Secondary | ICD-10-CM

## 2020-07-29 MED ORDER — SODIUM CHLORIDE 0.9 % IV SOLN: 500.0000 mL | Freq: Once | INTRAVENOUS | Status: DC

## 2020-07-29 NOTE — Progress Notes (Signed)
Pt's states no medical or surgical changes since previsit or office visit. 

## 2020-07-29 NOTE — Op Note (Addendum)
[facility] Patient Name: Abigail Hayes Procedure Date: 07/29/2020 7:28 AM MRN: [id number] Endoscopist: Milus Banister , MD Age: 59 Referring MD:  Date of Birth: [birth year] Gender: Female Account #: [id number] Procedure:                Colonoscopy Indications:              Screening for colorectal malignant neoplasm Medicines:                Monitored Anesthesia Care Procedure:                Pre-Anesthesia Assessment:                           - Prior to the procedure, a History and Physical                            was performed, and patient medications and                            allergies were reviewed. The patient's tolerance of                            previous anesthesia was also reviewed. The risks                            and benefits of the procedure and the sedation                            options and risks were discussed with the patient.                            All questions were answered, and informed consent                            was obtained. Prior Anticoagulants: The patient has                            taken no previous anticoagulant or antiplatelet                            agents. ASA Grade Assessment: II - A patient with                            mild systemic disease. After reviewing the risks                            and benefits, the patient was deemed in                            satisfactory condition to undergo the procedure.                           After obtaining informed consent, the colonoscope  was passed under direct vision. Throughout the                            procedure, the patient's blood pressure, pulse, and                            oxygen saturations were monitored continuously. The                            Colonoscope was introduced through the anus and                            advanced to the the cecum, identified by                            appendiceal orifice and  ileocecal valve. The                            colonoscopy was performed without difficulty. The                            patient tolerated the procedure well. The quality                            of the bowel preparation was good. The ileocecal                            valve, appendiceal orifice, and rectum were                            photographed. Scope In: 8:02:51 AM Scope Out: 8:15:36 AM Scope Withdrawal Time: 0 hours 10 minutes 19 seconds  Total Procedure Duration: 0 hours 12 minutes 45 seconds  Findings:                 Two sessile polyps were found in the transverse                            colon. The polyps were 3 to 4 mm in size. These                            polyps were removed with a cold snare. Resection                            and retrieval were complete.                           Multiple small-mouthed diverticula were found in                            the left colon.                           External hemorrhoids were found. The hemorrhoids  were small.                           The exam was otherwise without abnormality on                            direct and retroflexion views. Complications:            No immediate complications. Estimated blood loss:                            None. Estimated Blood Loss:     Estimated blood loss: none. Impression:               - Two 3 to 4 mm polyps in the transverse colon,                            removed with a cold snare. Resected and retrieved.                           - Diverticulosis in the left colon.                           - External hemorrhoids.                           - The examination was otherwise normal on direct                            and retroflexion views. Recommendation:           - Patient has a contact number available for                            emergencies. The signs and symptoms of potential                            delayed complications were  discussed with the                            patient. Return to normal activities tomorrow.                            Written discharge instructions were provided to the                            patient.                           - Resume previous diet.                           - Continue present medications. Please start taking                            OTC omeprazole 20mg  pills, one pill shortly before  your breakfast meal daily and call Dr. Ardis Hughs'                            office in 4 weeks to report on your response.                           - Await pathology results. Milus Banister, MD 07/29/2020 8:18:07 AM This report has been signed electronically.

## 2020-07-29 NOTE — Patient Instructions (Addendum)
HANDOUTS PROVIDED ON: POLYPS, DIVERTICULOSIS, & HEMORRHOIDS  The polyps removed today have been sent for pathology.  The results can take 1-3 weeks to receive.  When your next colonoscopy should occur will be based on the pathology results.    You Friedel resume your previous diet and medication schedule.  Begin taking Omeprazole 20 mg tablet about 30 minutes before your first meal each day and report back to Dr. Ardis Hughs in 4 weeks if it is helping.  Thank you for allowing [place] to care for you today!!!   YOU HAD AN ENDOSCOPIC PROCEDURE TODAY AT [facility]:   Refer to the procedure report that was given to you for any specific questions about what was found during the examination.  If the procedure report does not answer your questions, please call your gastroenterologist to clarify.  If you requested that your care partner not be given the details of your procedure findings, then the procedure report has been included in a sealed envelope for you to review at your convenience later.  YOU SHOULD EXPECT: Some feelings of bloating in the abdomen. Passage of more gas than usual.  Walking can help get rid of the air that was put into your GI tract during the procedure and reduce the bloating. If you had a lower endoscopy (such as a colonoscopy or flexible sigmoidoscopy) you [place] notice spotting of blood in your stool or on the toilet paper. If you underwent a bowel prep for your procedure, you Vale not have a normal bowel movement for a few days.  Please Note:  You might notice some irritation and congestion in your nose or some drainage.  This is from the oxygen used during your procedure.  There is no need for concern and it should clear up in a day or so.  SYMPTOMS TO REPORT IMMEDIATELY:   Following lower endoscopy (colonoscopy or flexible sigmoidoscopy):  Excessive amounts of blood in the stool  Significant tenderness or worsening of abdominal pains  Swelling of the abdomen that is  new, acute  Fever of 100F or higher  For urgent or emergent issues, a gastroenterologist can be reached at any hour by calling [phone]. Do not use MyChart messaging for urgent concerns.    DIET:  We do recommend a small meal at first, but then you [place] proceed to your regular diet.  Drink plenty of fluids but you should avoid alcoholic beverages for 24 hours.  ACTIVITY:  You should plan to take it easy for the rest of today and you should NOT DRIVE or use heavy machinery until tomorrow (because of the sedation medicines used during the test).    FOLLOW UP: Our staff will call the number listed on your records Wednesday morning between 7:15 am and 8:15 am to check on you and address any questions or concerns that you [place]viz[place] have regarding the information given to you following your procedure. If we do not reach you, we will leave a message.  We will attempt to reach you two times.  During this call, we will ask if you have developed any symptoms of COVID 19. If you develop any symptoms (ie: fever, flu-like symptoms, shortness of breath, cough etc.) before then, please call [phone].  If you test positive for Covid 19 in the 2 weeks post procedure, please call and report this information to [place].    If any biopsies were taken you will be contacted by phone or by letter within the next 1-3 weeks.  Please call us at [phone] if you have not heard about the biopsies in 3 weeks.    SIGNATURES/CONFIDENTIALITY: You and/or your care partner have signed paperwork which will be entered into your electronic medical record.  These signatures attest to the fact that that the information above on your After Visit Summary has been reviewed and is understood.  Full responsibility of the confidentiality of this discharge information lies with you and/or your care-partner.

## 2020-07-29 NOTE — Progress Notes (Signed)
To PACU, VSS. Report to [email or web address] 

## 2020-07-29 NOTE — Progress Notes (Signed)
Called to room to assist during endoscopic procedure.  Patient ID and intended procedure confirmed with present staff. Received instructions for my participation in the procedure from the performing physician.  

## 2020-07-31 ENCOUNTER — Telehealth: Payer: Self-pay

## 2020-07-31 ENCOUNTER — Telehealth: Payer: Self-pay | Admitting: *Deleted

## 2020-07-31 NOTE — Telephone Encounter (Signed)
  Follow up Call-  Call back number 07/29/2020  Post procedure Call Back phone  # [phone]  Permission to leave phone message Yes  Some recent data might be hidden     Patient questions:  Do you have a fever, pain , or abdominal swelling? No. Pain Score  0 *  Have you tolerated food without any problems? Yes.    Have you been able to return to your normal activities? Yes.    Do you have any questions about your discharge instructions: Diet   No. Medications  No. Follow up visit  No.  Do you have questions or concerns about your Care? No.  Actions: * If pain score is 4 or above: No action needed, pain <4.  1. Have you developed a fever since your procedure? no  2.   Have you had an respiratory symptoms (SOB or cough) since your procedure? no  3.   Have you tested positive for COVID 19 since your procedure no 4.   Have you had any family members/close contacts diagnosed with the COVID 19 since your procedure?  no   If yes to any of these questions please route to Joylene John, RN and Joella Prince, RN

## 2020-07-31 NOTE — Telephone Encounter (Signed)
First follow up call made. Left message. 

## 2020-08-05 ENCOUNTER — Encounter: Payer: Self-pay | Admitting: Gastroenterology

## 2020-08-06 DIAGNOSIS — L4059 Other psoriatic arthropathy: Secondary | ICD-10-CM | POA: Diagnosis not present

## 2020-08-06 DIAGNOSIS — E559 Vitamin D deficiency, unspecified: Secondary | ICD-10-CM | POA: Diagnosis not present

## 2020-08-06 DIAGNOSIS — Z79899 Other long term (current) drug therapy: Secondary | ICD-10-CM | POA: Diagnosis not present

## 2020-08-06 DIAGNOSIS — L409 Psoriasis, unspecified: Secondary | ICD-10-CM | POA: Diagnosis not present

## 2020-08-30 DIAGNOSIS — H40013 Open angle with borderline findings, low risk, bilateral: Secondary | ICD-10-CM | POA: Diagnosis not present

## 2020-12-10 DIAGNOSIS — H40013 Open angle with borderline findings, low risk, bilateral: Secondary | ICD-10-CM | POA: Diagnosis not present

## 2020-12-11 ENCOUNTER — Ambulatory Visit (INDEPENDENT_AMBULATORY_CARE_PROVIDER_SITE_OTHER): Payer: BLUE CROSS/BLUE SHIELD | Admitting: Family Medicine

## 2020-12-11 ENCOUNTER — Encounter: Payer: Self-pay | Admitting: Family Medicine

## 2020-12-11 ENCOUNTER — Other Ambulatory Visit: Payer: Self-pay

## 2020-12-11 VITALS — BP 124/62 | HR 89 | Temp 98.3°F | Wt 160.3 lb

## 2020-12-11 DIAGNOSIS — H9122 Sudden idiopathic hearing loss, left ear: Secondary | ICD-10-CM | POA: Diagnosis not present

## 2020-12-11 MED ORDER — PREDNISONE 20 MG PO TABS: ORAL_TABLET | ORAL | 0 refills | Status: DC

## 2020-12-11 NOTE — Progress Notes (Signed)
Established Patient Office Visit  Subjective:  Patient ID: Abigail Hayes, female    DOB: Aug 24, 1961  Age: 60 y.o. MRN: [id number]  CC:  Chief Complaint  Patient presents with  . Ear Pain    R ear, x 3 days, hearing loss, states that she fell asleep with her air pods in a few times    HPI Abigail Hayes presents for sudden loss of hearing right ear about 3 days ago.  She frequently wears air pods in her right ear more than her left but has not been exposed any particularly loud noises recently.  She initially wondered if this [place] be wax.  She used some hydrogen peroxide without improvement.  She has had some associated tinnitus and mild right ear pain.  No vertigo.  No ear drainage.  No fevers or chills.  No recent sinus congestion.  Symptoms occurred suddenly few days ago.  No headaches.  No visual changes.  No other focal neurologic symptoms.   Past Medical History:  Diagnosis Date  . Arthritis    Psoriatic  . Chicken pox   . Enlargement of lymph nodes 07/17/2009  . GERD (gastroesophageal reflux disease)   . PSORIATIC ARTHRITIS 07/17/2009    Past Surgical History:  Procedure Laterality Date  . ANTERIOR CRUCIATE LIGAMENT REPAIR  2006  . COLONOSCOPY  2011  . Ovarian cyst removed      Family History  Problem Relation Age of Onset  . Hyperlipidemia Other   . Hypertension Other   . Cancer Other        ovarian, throat  . Heart disease Other   . Cancer Mother 14       endometrial cancer  . Breast cancer Mother   . Cancer Father        ?tonsillar cancer  . Esophageal cancer Father   . Colon cancer Neg Hx   . Colon polyps Neg Hx   . Stomach cancer Neg Hx   . Rectal cancer Neg Hx     Social History   Socioeconomic History  . Marital status: Married    Spouse name: Not on file  . Number of children: 3  . Years of education: Not on file  . Highest education level: Not on file  Occupational History  . Occupation: Copywriter, advertising: [employer]  Tobacco Use  .  Smoking status: Never Smoker  . Smokeless tobacco: Never Used  Vaping Use  . Vaping Use: Never used  Substance and Sexual Activity  . Alcohol use: Yes  . Drug use: No  . Sexual activity: Not on file  Other Topics Concern  . Not on file  Social History Narrative   Right handed   Two story home   Drinks caffeine   Social Determinants of Health   Financial Resource Strain: Not on file  Food Insecurity: Not on file  Transportation Needs: Not on file  Physical Activity: Not on file  Stress: Not on file  Social Connections: Not on file  Intimate Partner Violence: Not on file    Outpatient Medications Prior to Visit  Medication Sig Dispense Refill  . alendronate (FOSAMAX) 70 MG tablet Take 70 mg by mouth once a week.    Marland Kitchen EPINEPHrine (EPIPEN 2-PAK) 0.3 mg/0.3 mL IJ SOAJ injection EpiPen 2-Pak 0.3 mg/0.3 mL injection, auto-injector 1 each 1  . ibuprofen (ADVIL,MOTRIN) 200 MG tablet Take 200 mg by mouth every 6 (six) hours as needed for pain.    Marland Kitchen latanoprost (  XALATAN) 0.005 % ophthalmic solution SMARTSIG:In Eye(s)    . Secukinumab 150 MG/ML SOAJ Inject into the skin.    Marland Kitchen zolpidem (AMBIEN) 5 MG tablet Take 1 tablet (5 mg total) by mouth at bedtime as needed for sleep. 15 tablet 0   No facility-administered medications prior to visit.    Allergies  Allergen Reactions  . Other     Red meat    ROS Review of Systems  Constitutional: Negative for chills and fever.  HENT: Positive for hearing loss and tinnitus.   Neurological: Negative for dizziness, seizures, speech difficulty, weakness and headaches.      Objective:    Physical Exam Vitals reviewed.  Constitutional:      Appearance: Normal appearance.  HENT:     Ears:     Comments: No significant cerumen in either canal.  Eardrums are normal in appearance.  No obvious effusion.  With tuning fork air conduction is greater than bone conduction bilaterally Cardiovascular:     Rate and Rhythm: Normal rate and regular  rhythm.  Pulmonary:     Effort: Pulmonary effort is normal.     Breath sounds: Normal breath sounds.  Musculoskeletal:     Cervical back: Neck supple.  Neurological:     General: No focal deficit present.     Mental Status: She is alert and oriented to person, place, and time.     Cranial Nerves: No cranial nerve deficit.     BP 124/62 (BP Location: Left Arm, Patient Position: Sitting, Cuff Size: Normal)   Pulse 89   Temp 98.3 F (36.8 C) (Oral)   Wt 160 lb 4.8 oz (72.7 kg)   LMP 06/03/2011   SpO2 96%   BMI 27.52 kg/m  Wt Readings from Last 3 Encounters:  12/11/20 160 lb 4.8 oz (72.7 kg)  07/29/20 160 lb (72.6 kg)  07/19/20 160 lb (72.6 kg)     Health Maintenance Due  Topic Date Due  . HIV Screening  Never done  . PAP SMEAR-Modifier  11/02/2016  . COVID-19 Vaccine (3 - Booster for Moderna series) 07/03/2020    There are no preventive care reminders to display for this patient.  Lab Results  Component Value Date   TSH 2.56 06/12/2020   Lab Results  Component Value Date   WBC 4.1 08/31/2018   HGB 14.8 08/31/2018   HCT 45 08/31/2018   MCV 99.1 11/01/2013   PLT 372 08/31/2018    Lab Results  Component Value Date   CHOL 241 (H) 06/12/2020   Lab Results  Component Value Date   HDL 77 06/12/2020   Lab Results  Component Value Date   LDLCALC 142 (H) 06/12/2020   Lab Results  Component Value Date   TRIG 105 06/12/2020   Lab Results  Component Value Date   CHOLHDL 3.1 06/12/2020   No results found for: HGBA1C    Assessment & Plan:   Patient presents with sudden hearing loss right ear.-Question sudden sensorineural hearing loss.  No evidence for cerumen impaction.  No obvious serous effusion.  Otherwise nonfocal neuro exam.  -We discussed setting up audiometry assessment as soon as possible hopefully within the next week -Start prednisone 60 mg daily for 10 days.  Reviewed potential side effects.  She has tolerated prednisone in the past. -We  discussed possible ENT referral.  She would like to see response of the next couple days first. -We also discussed low threshold to consider further evaluation such as MRI scan unless she  is not having prompt improvement with the prednisone.  Meds ordered this encounter  Medications  . predniSONE (DELTASONE) 20 MG tablet    Sig: Take three tablets by mouth once daily for 10 days.    Dispense:  30 tablet    Refill:  0    Follow-up: No follow-ups on file.    Carolann Littler, MD

## 2020-12-11 NOTE — Patient Instructions (Signed)
Hearing Loss Hearing loss is a partial or total loss of the ability to hear. This can be temporary or permanent, and it can happen in one or both ears. Medical care is necessary to treat hearing loss properly and to prevent the condition from getting worse. Your hearing [place] partially or completely come back, depending on what caused your hearing loss and how severe it is. In some cases, hearing loss is permanent. What are the causes? Common causes of hearing loss include:  Too much wax in the ear canal.  Infection of the ear canal or middle ear.  Fluid in the middle ear.  Injury to the ear or surrounding area.  An object stuck in the ear.  A history of prolonged exposure to loud sounds, such as music. Less common causes of hearing loss include:  Tumors in the ear.  Viral or bacterial infections, such as meningitis.  A hole in the eardrum (perforated eardrum).  Problems with the hearing nerve that sends signals between the brain and the ear.  Certain medicines. What are the signs or symptoms? Symptoms of this condition [place] include:  Difficulty telling the difference between sounds.  Difficulty following a conversation when there is background noise.  Lack of response to sounds in your environment. This [place] be most noticeable when you do not respond to startling sounds.  Needing to turn up the volume on the television, radio, or other devices.  Ringing in the ears.  Dizziness. How is this diagnosed? This condition is diagnosed based on:  A physical exam.  A hearing test (audiometry). The audiometry test will be performed by a hearing specialist (audiologist). You Lund also be referred to an ear, nose, and throat (ENT) specialist (otolaryngologist).   How is this treated? Treatment for hearing loss [place] include:  Ear wax removal.  Medicines to treat or prevent infection (antibiotics).  Medicines to reduce inflammation (corticosteroids).  Hearing aids for hearing  loss related to nerve damage. Follow these instructions at home:  If you were prescribed an antibiotic medicine, take it as told by your health care provider. Do not stop taking the antibiotic even if you start to feel better.  Take over-the-counter and prescription medicines only as told by your health care provider.  Avoid loud noises.  Return to your normal activities as told by your health care provider. Ask your health care provider what activities are safe for you.  Keep all follow-up visits as told by your health care provider. This is important. Contact a health care provider if:  You feel dizzy.  You develop new symptoms.  You vomit or feel nauseous.  You have a fever. Get help right away if:  You develop sudden changes in your vision.  You have severe ear pain.  You have new or increased weakness.  You have a severe headache. Summary  Hearing loss is a decreased ability to hear sounds around you. It can be temporary or permanent.  Treatment will depend on the cause of your hearing loss. It [place] include ear wax removal, medicines, or a hearing aid.  Your hearing [place] partially or completely come back, depending on what caused your hearing loss and how severe it is.  Keep all follow-up visits as told by your health care provider. This is important. This information is not intended to replace advice given to you by your health care provider. Make sure you discuss any questions you have with your health care provider. Document Revised: 05/31/2018 Document Reviewed: 05/31/2018 Elsevier   Patient Education  2021 [place].  Set up Audiology assessment of hearing.

## 2020-12-13 ENCOUNTER — Telehealth: Payer: Self-pay | Admitting: Family Medicine

## 2020-12-13 MED ORDER — NITROFURANTOIN MONOHYD MACRO 100 MG PO CAPS: 100.0000 mg | ORAL_CAPSULE | Freq: Two times a day (BID) | ORAL | 0 refills | Status: DC

## 2020-12-13 NOTE — Telephone Encounter (Signed)
Patient states that her hearing has improved some and would like to know how long it will take to get her hearing back fully. She also stated that the prednisone is keeping her up at night and would like to know if we can cut back the dose.

## 2020-12-13 NOTE — Telephone Encounter (Signed)
OK to send in [place] one po bid for 5 days.

## 2020-12-13 NOTE — Telephone Encounter (Signed)
Rx sent in.  Spoke with the patient. She is aware her prescription has been sent in. Nothing further needed.

## 2020-12-13 NOTE — Telephone Encounter (Signed)
Patient is calling and is requesting a call back regarding the dosage prednisone, please advise. CB is [phone]

## 2020-12-13 NOTE — Telephone Encounter (Signed)
I spoke with Abigail Hayes.  Fortunately, she has had almost total resolution of her tinnitus and hearing loss.  Unfortunately, she is having some insomnia probably related to the prednisone.  She will try scaling this back to 40 mg daily over the weekend and finish out this dose if possible.  If she has any recurrent symptoms whatsoever recommend ENT referral and audiology assessment

## 2020-12-13 NOTE — Telephone Encounter (Signed)
Patient is calling back and stated that she took a at home UTI test kit and it came back positive for leukocytes. Pt wanted to see if the provider can send a prescription to [facility] in [place] in [facility], please advise. CB is [phone]

## 2020-12-18 ENCOUNTER — Encounter: Payer: Self-pay | Admitting: Family Medicine

## 2020-12-18 DIAGNOSIS — H9122 Sudden idiopathic hearing loss, left ear: Secondary | ICD-10-CM

## 2020-12-24 ENCOUNTER — Encounter: Payer: Self-pay | Admitting: Family Medicine

## 2020-12-24 DIAGNOSIS — H93293 Other abnormal auditory perceptions, bilateral: Secondary | ICD-10-CM | POA: Diagnosis not present

## 2020-12-24 DIAGNOSIS — R350 Frequency of micturition: Secondary | ICD-10-CM | POA: Diagnosis not present

## 2020-12-24 DIAGNOSIS — N952 Postmenopausal atrophic vaginitis: Secondary | ICD-10-CM | POA: Diagnosis not present

## 2020-12-24 NOTE — Telephone Encounter (Signed)
Patient is calling to advise that she still has a UTI. She was offered an appointment but refused. She stated she would rather have a nurse to call her.  Please advise.

## 2020-12-24 NOTE — Telephone Encounter (Signed)
If recent symptoms not clear with Macrobid we do need to follow-up to consider urine culture if necessary to further evaluate

## 2020-12-24 NOTE — Telephone Encounter (Signed)
Please advise. This is a reoccurring issue for the patient. Is an appointment needed?

## 2020-12-24 NOTE — Telephone Encounter (Signed)
Left message for patient to call back  

## 2020-12-25 NOTE — Telephone Encounter (Signed)
Spoke with the patient. She has already been seen elsewhere for this and an appointment is no longer needed.

## 2021-01-15 DIAGNOSIS — N952 Postmenopausal atrophic vaginitis: Secondary | ICD-10-CM | POA: Diagnosis not present

## 2021-01-15 DIAGNOSIS — M7918 Myalgia, other site: Secondary | ICD-10-CM | POA: Diagnosis not present

## 2021-01-15 DIAGNOSIS — R102 Pelvic and perineal pain: Secondary | ICD-10-CM | POA: Diagnosis not present

## 2021-02-06 DIAGNOSIS — L409 Psoriasis, unspecified: Secondary | ICD-10-CM | POA: Diagnosis not present

## 2021-02-06 DIAGNOSIS — E559 Vitamin D deficiency, unspecified: Secondary | ICD-10-CM | POA: Diagnosis not present

## 2021-02-06 DIAGNOSIS — L4059 Other psoriatic arthropathy: Secondary | ICD-10-CM | POA: Diagnosis not present

## 2021-02-06 DIAGNOSIS — R1903 Right lower quadrant abdominal swelling, mass and lump: Secondary | ICD-10-CM | POA: Diagnosis not present

## 2021-02-06 DIAGNOSIS — R1900 Intra-abdominal and pelvic swelling, mass and lump, unspecified site: Secondary | ICD-10-CM | POA: Diagnosis not present

## 2021-02-06 DIAGNOSIS — Z79899 Other long term (current) drug therapy: Secondary | ICD-10-CM | POA: Diagnosis not present

## 2021-02-06 DIAGNOSIS — R1031 Right lower quadrant pain: Secondary | ICD-10-CM | POA: Diagnosis not present

## 2021-02-14 ENCOUNTER — Other Ambulatory Visit: Payer: Self-pay | Admitting: Obstetrics and Gynecology

## 2021-02-14 DIAGNOSIS — R1900 Intra-abdominal and pelvic swelling, mass and lump, unspecified site: Secondary | ICD-10-CM

## 2021-02-24 ENCOUNTER — Other Ambulatory Visit: Payer: BLUE CROSS/BLUE SHIELD

## 2021-03-01 ENCOUNTER — Ambulatory Visit
Admission: RE | Admit: 2021-03-01 | Discharge: 2021-03-01 | Disposition: A | Discharge status: Home / Self Care | Payer: BLUE CROSS/BLUE SHIELD | Source: Ambulatory Visit | Attending: Obstetrics and Gynecology | Admitting: Obstetrics and Gynecology

## 2021-03-01 ENCOUNTER — Other Ambulatory Visit: Payer: Self-pay

## 2021-03-01 DIAGNOSIS — R102 Pelvic and perineal pain: Secondary | ICD-10-CM | POA: Diagnosis not present

## 2021-03-01 DIAGNOSIS — R1900 Intra-abdominal and pelvic swelling, mass and lump, unspecified site: Secondary | ICD-10-CM

## 2021-03-01 MED ORDER — GADOBENATE DIMEGLUMINE 529 MG/ML IV SOLN
20.0000 mL | Freq: Once | INTRAVENOUS | Status: AC | PRN
  Administered 2021-03-01: 20 mL via INTRAVENOUS

## 2021-03-04 ENCOUNTER — Other Ambulatory Visit: Payer: BLUE CROSS/BLUE SHIELD

## 2021-03-06 DIAGNOSIS — R1900 Intra-abdominal and pelvic swelling, mass and lump, unspecified site: Secondary | ICD-10-CM | POA: Diagnosis not present

## 2021-03-07 ENCOUNTER — Encounter: Payer: Self-pay | Admitting: Gynecologic Oncology

## 2021-03-12 ENCOUNTER — Inpatient Hospital Stay: Payer: BLUE CROSS/BLUE SHIELD | Attending: Gynecologic Oncology | Admitting: Gynecologic Oncology

## 2021-03-12 ENCOUNTER — Encounter: Payer: Self-pay | Admitting: Gynecologic Oncology

## 2021-03-12 ENCOUNTER — Other Ambulatory Visit: Payer: Self-pay

## 2021-03-12 VITALS — BP 156/77 | HR 57 | Temp 97.0°F | Resp 18 | Ht 64.0 in | Wt 158.6 lb

## 2021-03-12 DIAGNOSIS — Z803 Family history of malignant neoplasm of breast: Secondary | ICD-10-CM | POA: Insufficient documentation

## 2021-03-12 DIAGNOSIS — K219 Gastro-esophageal reflux disease without esophagitis: Secondary | ICD-10-CM | POA: Diagnosis not present

## 2021-03-12 DIAGNOSIS — L4050 Arthropathic psoriasis, unspecified: Secondary | ICD-10-CM | POA: Insufficient documentation

## 2021-03-12 DIAGNOSIS — Z800 Family history of malignant neoplasm of digestive organs: Secondary | ICD-10-CM | POA: Diagnosis not present

## 2021-03-12 DIAGNOSIS — N838 Other noninflammatory disorders of ovary, fallopian tube and broad ligament: Secondary | ICD-10-CM

## 2021-03-12 DIAGNOSIS — M810 Age-related osteoporosis without current pathological fracture: Secondary | ICD-10-CM | POA: Insufficient documentation

## 2021-03-12 DIAGNOSIS — R194 Change in bowel habit: Secondary | ICD-10-CM | POA: Insufficient documentation

## 2021-03-12 DIAGNOSIS — D270 Benign neoplasm of right ovary: Secondary | ICD-10-CM | POA: Diagnosis not present

## 2021-03-12 NOTE — Patient Instructions (Signed)
Dr Denman George feels comfortable that the lesion seen on ultrasound does not represent an ovarian cancer. It is reasonable to re-look at it again in 6 months with another ultrasound if this would provide peace of mind. If stable at that time, it could be evaluated in another year.  Dr Denman George would be happy to refer you for a genetics consultation at your request. Her number is [phone] if you have any questions about today's visit or would like consultation with a genetics specialist to discuss comprehensive testing.

## 2021-03-12 NOTE — Progress Notes (Signed)
Consult Note: Gyn-Onc  Consult was requested by Dr. Gaetano Net for the evaluation of Abigail Hayes 60 y.o. female  CC:  Chief Complaint  Patient presents with   ovarian mass seen on [place]    Assessment/Plan:  Ms. Abigail Hayes  is a 60 y.o.  year old with the finding of a 2 cm avascular solid nodule on the right ovary on ultrasound in the setting of abnormal MRI of the pelvis and normal Ca1 25.  Patient is known to be BRCA negative and of average risk for ovarian cancer.  Her exam was normal.  I reassured the patient that I have a low suspicion of occult malignancy in the right ovary.  I explained why there can be differences between different modalities of imaging.  However the MRI of the pelvis is considered the gold standard for evaluation of complex or concerning adnexal mass features, and given the reassuring findings on MRI (which we reviewed together including the images) I feel confident that she does not have an occult cancer does not need an intervention for this.  I offered her genetic screening that she reported a high level of anxiety regarding cancer risk.  She had office-based BRCA testing in the past that was negative, however has not had comprehensive genetic evaluation.  Given that her mother had to malignancies it is not unreasonable to consider her for this.  However her mother was obese, and had 2 obesity related malignancies which [place] explain her 2 cancers.  With respect to ongoing management of this right adnexal mass, I offered her either no ongoing surveillance which I think is reasonable given the reassuring MRI and tumor marker.  Alternatively, if she is anxious about this mass, she could have a repeat ultrasound scan at Dr. Sherlynn Stalls office in 6 months, and if stable, repeat ultrasound scan 12 months following this and annually so long as she desires reassurance.  Explained to the patient the high risk for false positives on ultrasound imaging of the pelvis, and 11% risk of  major complication at the time of surgical intervention for false positive ultrasound findings based on prior studies of ovarian cancer screening strategies.  I recommended she follow-up with gastroenterology given her symptoms of intermittent diarrhea and change in bowel habit.  There is a small possibility that this right adnexal mass is intestinal tract which reinforces her need for follow-up with gastroenterology.   HPI: Ms Abigail Hayes is a 60 year old P3 who was seen in consultation at the request of Dr Gaetano Net for evaluation of a right ovarian solid lesion.  Patient had a history of acute unilateral hearing loss in the spring 2022.  This prompted her to take high-dose steroids which followed with some vaginal irritation and leukocytes on urine test strips.  To work this up she was seen by the nurse practitioner and Dr. Sherlynn Stalls office who determined that she did not have a UTI.  Her symptoms continued and therefore she saw Dr. Gaetano Net, and at that time reported intermittent gastrointestinal symptoms such as intermittent diarrhea or and frequency, as well as intermittent right lower quadrant pains.  This prompted Dr. Gaetano Net to perform an office-based transvaginal ultrasound scan on 02/06/2021 which identified the uterus measuring 6.6 x 4.8 x 3.3 cm with a 3.9 mm endometrial stripe.  The left ovary measured 2 x 0.7 x 1.3 cm and was grossly normal.  The right adnexa contained a 2.4 x 2.3 cm solid-appearing mass with shadowing and no normal ovarian tissue  seen.  There was some peripheral blood flow seen in very small amounts to them that the mass itself was not hypervascular.  An MRI of the pelvis was ordered and performed on 03/01/2021 to follow-up the right adnexal mass seen on [place]. This showed a uterus measuring 7.3 x 3.2 x 4.2 cm with no fibroids or other masses identified.  The cervix and vagina were normal.  The right and left ovaries were not visualized due to postmenopausal atrophy, and no adnexal  masses were identified.  There was no peritoneal thickening or free fluid.  There is no lymphadenopathy.  It was an essentially benign exam.  Given the discordance between the original ultrasound and the MRI of repeat ultrasound scan was performed on 03/06/2021 by Dr. Sherlynn Stalls office, and this again identified a right adnexal solid-appearing mass measuring 1.9 x 1.8 cm with no blood flow seen within the mass.  The left adnexa was normal.  Her medical history is most significant for psoriatic arthritis.  Her surgical history is most significant for laparoscopy for ovarian cyst.  Her gynecologic history is remarkable for menorrhagia prior to menopause, SVD x 3.  Her family cancer history is significant for a mother with breast and endometrial cancers in her 71's and a father (smoker) with esophageal cancer.  She works as a Armed forces technical officer. She lives with her husband.   Current Meds:  Outpatient Encounter Medications as of 03/12/2021  Medication Sig   alendronate (FOSAMAX) 70 MG tablet Take 70 mg by mouth once a week.   latanoprost (XALATAN) 0.005 % ophthalmic solution SMARTSIG:In Eye(s)   NONFORMULARY OR COMPOUNDED ITEM Compounded Estradiol 0.01% vaginal cream  Use 1 gram twice weekly and at night.   Secukinumab 150 MG/ML SOAJ Inject into the skin. Once a month   EPINEPHrine (EPIPEN 2-PAK) 0.3 mg/0.3 mL IJ SOAJ injection EpiPen 2-Pak 0.3 mg/0.3 mL injection, auto-injector (Patient not taking: Reported on 03/07/2021)   ibuprofen (ADVIL,MOTRIN) 200 MG tablet Take 200 mg by mouth every 6 (six) hours as needed for pain. (Patient not taking: Reported on 03/07/2021)   zolpidem (AMBIEN) 5 MG tablet Take 1 tablet (5 mg total) by mouth at bedtime as needed for sleep. (Patient not taking: Reported on 03/07/2021)   [DISCONTINUED] nitrofurantoin, macrocrystal-monohydrate, (MACROBID) 100 MG capsule Take 1 capsule (100 mg total) by mouth 2 (two) times daily.   [DISCONTINUED] predniSONE (DELTASONE) 20 MG  tablet Take three tablets by mouth once daily for 10 days.   No facility-administered encounter medications on file as of 03/12/2021.    Allergy:  Allergies  Allergen Reactions   Alpha-Gal Anaphylaxis, Hives and Cough    Galactose-Alpha-1,3-Galactose   Other Anaphylaxis    Red meat: Mammalian Meat Allergy    Social Hx:   Social History   Socioeconomic History   Marital status: Married    Spouse name: Not on file   Number of children: 3   Years of education: Not on file   Highest education level: Not on file  Occupational History   Occupation: Copywriter, advertising: [employer]  Tobacco Use   Smoking status: Never   Smokeless tobacco: Never  Vaping Use   Vaping Use: Never used  Substance and Sexual Activity   Alcohol use: Not Currently    Comment: 1-2 glases of wine with dinner   Drug use: No   Sexual activity: Yes    Birth control/protection: Post-menopausal    Comment: Vasectomy  Other Topics Concern   Not on file  Social  History Narrative   Right handed   Two story home   Drinks caffeine   Social Determinants of Health   Financial Resource Strain: Not on file  Food Insecurity: Not on file  Transportation Needs: Not on file  Physical Activity: Not on file  Stress: Not on file  Social Connections: Not on file  Intimate Partner Violence: Not on file    Past Surgical Hx:  Past Surgical History:  Procedure Laterality Date   ANTERIOR CRUCIATE LIGAMENT REPAIR Left 2004   COLONOSCOPY  2011   2011; 07-2020   Ovarian cyst removed      Past Medical Hx:  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  Family Hx:  Family History  Problem Relation  Age of Onset   Breast cancer Mother    Endometrial cancer Mother    Cancer Father        ?tonsillar cancer   Esophageal cancer Father    Ovarian cancer Paternal Aunt    Colon cancer Other    Hyperlipidemia Other    Hypertension Other    Heart disease Other    Prostate cancer Other    Pancreatic cancer Other    Colon polyps Neg Hx    Stomach cancer Neg Hx    Rectal cancer Neg Hx     Review of Systems:  Constitutional  Feels well,    ENT Normal appearing ears and nares bilaterally Skin/Breast  No rash, sores, jaundice, itching, dryness Cardiovascular  No chest pain, shortness of breath, or edema  Pulmonary  No cough or wheeze.  Gastro Intestinal  No nausea, vomitting, +diarrhoea. No bright red blood per rectum, change in bowel movement, or constipation. + occasional RLQ pains. Genito Urinary  No frequency, urgency, dysuria, no bleeding Musculo Skeletal  No myalgia, arthralgia, joint swelling or pain  Neurologic  No weakness, numbness, change in gait,  Psychology  No depression, anxiety, insomnia.   Vitals:  Blood pressure (!) 156/77, pulse (!) 57, temperature (!) 97 F (36.1 C), temperature source Tympanic, resp. rate 18, height $RemoveBe'5\' 4"'[id number]$  (1.626 m), weight 158 lb 9.6 oz (71.9 kg), last menstrual period 06/03/2011, SpO2 100 %.  Physical Exam: WD in NAD Neck  Supple NROM, without any enlargements.  Lymph Node Survey No cervical supraclavicular or inguinal adenopathy Cardiovascular  Well perfused peripheries.  Lungs  No increased WOB Skin  No rash/lesions/breakdown  Psychiatry  Alert and oriented to person, place, and time  Abdomen  Normoactive bowel sounds, abdomen soft, non-tender and obese without evidence of hernia.  Back No CVA tenderness Genito Urinary  Vulva/vagina: Normal external female genitalia.   No lesions. No discharge or bleeding.  Bladder/urethra:  No lesions or masses, well supported bladder  Vagina: normal  Cervix: Normal appearing, no  lesions.  Uterus:  Small, mobile, no parametrial involvement or nodularity.  Adnexa: no palpable masses. Rectal  deferred Extremities  No bilateral cyanosis, clubbing or edema.  60 minutes of total time was spent for this patient encounter, including preparation, face-to-face counseling with the patient and coordination of care, review of imaging (results and images), communication with the referring provider and documentation of the encounter.   Lenard Simmer  Denman George, MD  03/12/2021, 11:14 AM

## 2021-03-14 DIAGNOSIS — N83209 Unspecified ovarian cyst, unspecified side: Secondary | ICD-10-CM

## 2021-03-14 HISTORY — DX: Unspecified ovarian cyst, unspecified side: N83.209

## 2021-04-16 DIAGNOSIS — H40013 Open angle with borderline findings, low risk, bilateral: Secondary | ICD-10-CM | POA: Diagnosis not present

## 2021-04-23 ENCOUNTER — Other Ambulatory Visit (INDEPENDENT_AMBULATORY_CARE_PROVIDER_SITE_OTHER): Payer: BLUE CROSS/BLUE SHIELD

## 2021-04-23 ENCOUNTER — Encounter: Payer: Self-pay | Admitting: Nurse Practitioner

## 2021-04-23 ENCOUNTER — Ambulatory Visit (INDEPENDENT_AMBULATORY_CARE_PROVIDER_SITE_OTHER): Payer: BLUE CROSS/BLUE SHIELD | Admitting: Nurse Practitioner

## 2021-04-23 VITALS — BP 120/78 | HR 62 | Ht 64.0 in | Wt 159.0 lb

## 2021-04-23 DIAGNOSIS — R197 Diarrhea, unspecified: Secondary | ICD-10-CM | POA: Diagnosis not present

## 2021-04-23 DIAGNOSIS — R140 Abdominal distension (gaseous): Secondary | ICD-10-CM

## 2021-04-23 LAB — CBC WITH DIFFERENTIAL/PLATELET
Basophils Absolute: 0 10*3/uL (ref 0.0–0.1)
Basophils Relative: 0.9 % (ref 0.0–3.0)
Eosinophils Absolute: 0.3 10*3/uL (ref 0.0–0.7)
Eosinophils Relative: 6.8 % — ABNORMAL HIGH (ref 0.0–5.0)
HCT: 41.8 % (ref 36.0–46.0)
Hemoglobin: 14.1 g/dL (ref 12.0–15.0)
Lymphocytes Relative: 41.1 % (ref 12.0–46.0)
Lymphs Abs: 1.7 10*3/uL (ref 0.7–4.0)
MCHC: 33.8 g/dL (ref 30.0–36.0)
MCV: 98.7 fl (ref 78.0–100.0)
Monocytes Absolute: 0.4 10*3/uL (ref 0.1–1.0)
Monocytes Relative: 9.5 % (ref 3.0–12.0)
Neutro Abs: 1.7 10*3/uL (ref 1.4–7.7)
Neutrophils Relative %: 41.7 % — ABNORMAL LOW (ref 43.0–77.0)
Platelets: 302 10*3/uL (ref 150.0–400.0)
RBC: 4.24 Mil/uL (ref 3.87–5.11)
RDW: 12.8 % (ref 11.5–15.5)
WBC: 4.1 10*3/uL (ref 4.0–10.5)

## 2021-04-23 LAB — COMPREHENSIVE METABOLIC PANEL
ALT: 16 U/L (ref 0–35)
AST: 15 U/L (ref 0–37)
Albumin: 4.1 g/dL (ref 3.5–5.2)
Alkaline Phosphatase: 72 U/L (ref 39–117)
BUN: 11 mg/dL (ref 6–23)
CO2: 22 mEq/L (ref 19–32)
Calcium: 9.6 mg/dL (ref 8.4–10.5)
Chloride: 104 mEq/L (ref 96–112)
Creatinine, Ser: 0.67 mg/dL (ref 0.40–1.20)
GFR: 94.63 mL/min (ref >60.00)
Glucose, Bld: 91 mg/dL (ref 70–99)
Potassium: 3.9 mEq/L (ref 3.5–5.1)
Sodium: 139 mEq/L (ref 135–145)
Total Bilirubin: 0.4 mg/dL (ref 0.2–1.2)
Total Protein: 7.8 g/dL (ref 6.0–8.3)

## 2021-04-23 MED ORDER — DICYCLOMINE HCL 10 MG PO CAPS: 10.0000 mg | ORAL_CAPSULE | Freq: Three times a day (TID) | ORAL | 0 refills | Status: DC | PRN

## 2021-04-23 NOTE — Patient Instructions (Addendum)
LABS:  Lab work has been ordered for you today. Our lab is located in the basement. Press "B" on the elevator. The lab is located at the first door on the left as you exit the elevator.  HEALTHCARE LAWS AND MY CHART RESULTS: Due to recent changes in healthcare laws, you [place] see the results of your imaging and laboratory studies on MyChart before your provider has had a chance to review them.   We understand that in some cases there [place] be results that are confusing or concerning to you. Not all laboratory results come back in the same time frame and the provider [place] be waiting for multiple results in order to interpret others.  Please give [place] 48 hours in order for your provider to thoroughly review all the results before contacting the office for clarification of your results.   MEDICATION: We have sent the following medication to your pharmacy for you to pick up at your convenience: Dicyclomine 10 MG 3 times a day as needed for abdominal pain/cramping.  OVER THE COUNTER MEDICATION Please purchase the following medications over the counter and take as directed:  Hardin Negus Bacteria Probiotic once a day. Imodium 1 tablet once or twice a day as needed.  RECOMMENDATIONS: Avoid dairy for the next 2 weeks.  We have scheduled you a follow up with Dr. Ardis Hughs on 07/02/21 at 9:50am.  It was great seeing you today! Thank you for entrusting me with your care and choosing [facility].  Noralyn Pick, CRNP  If you are age 61 or younger, your body mass index should be between 19-25. Your Body mass index is 27.29 kg/m. If this is out of the aformentioned range listed, please consider follow up with your Primary Care Provider.   The [place] [facility] providers would like to encourage you to use [facility] to communicate with providers for non-urgent requests or questions.  Due to long hold times on the telephone, sending your provider a message by [facility] be faster and more efficient way to  get a response. Please allow 48 business hours for a response.  Please remember that this is for non-urgent requests/questions.

## 2021-04-23 NOTE — Progress Notes (Signed)
04/23/2021 Abigail Hayes [id number]:[id number] [birth year]   CHIEF COMPLAINT: sensitive stomach, diarrhea   HISTORY OF PRESENT ILLNESS: Abigail Hayes is a 60 year old female with a past medical history of psoriatic arthritis, osteoporosis, alpha gal deficiency, GERD and colon polyps. She is followed by Dr. Ardis Hayes. She presents to our office today as referred by her gynecologist Dr. Denman Hayes. She complains of having a sensitive stomach since childhood with frequent diarrhea. She typically passes a normal formed brown stool in the am followed by 1 or 2 looser stools. No specific food triggers. Over the past 3 to 4 months, she is having urgent "runny diarrhea" every time she eats and she is awakening 2 to 3 nights weekly and passing 2 to 3 diarrhea stools. No bloody diarrhea. No oily stools or floating stools. No lower abdominal pain. She complains of upper abdominal bloat/distension, upper abdomen feels rock hard. She has localized RUQ pain which occurs when standing and when she pushes on this area. No N/V. Infrequent heartburn, had noticeable heartburn 5 years ago. She was prescribed Bactrim for an infection 10/220, no other antibiotics since then. She has gained 10 lbs since January 2022. She went on a wheat and dairy free diet x 2 weeks without improvement. She was seen by her gynecologist Dr. Denman Hayes and she underwent a pelvic sonogram 02/06/2021 which identified a 2.4 x 2.3 cm right ovarian mass. An MRI of the pelvis was ordered and performed on 03/01/2021 to follow-up the right adnexal mass seen on [place]. This showed a uterus measuring 7.3 x 3.2 x 4.2 cm with no fibroids or other masses identified.  The cervix and vagina were normal.  The right and left ovaries were not visualized due to postmenopausal atrophy, and no adnexal masses were identified.  She underwent a colonoscopy by Dr. Ardis Hayes 07/29/2020 which identified two 3 to 4 mm tubular adenomatous polyps removed from the transverse colon, left colon diverticulosis  and external hemorrhoids and she was advised to repeat a colonoscopy in 7 years. No recent labs in [place]. TSH 2.568 positive alpha gal IgE 05/2020   Colonoscopy 07/29/2020 by Dr. Ardis Hayes: - Two 3 to 4 mm polyps in the transverse colon, removed with a cold snare. Resected and retrieved. - Diverticulosis in the left colon. - External hemorrhoids. - The examination was otherwise normal on direct and retroflexion views. - 7 year recall TUBULAR ADENOMA (2). - NO HIGH GRADE DYSPLASIA OR CARCINOMA.  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 She has never used smokeless tobacco. She reports previous alcohol use. She reports that she does not use drugs.  Family History: family history includes Breast cancer in her mother; Cancer in her father; Colon cancer in an other family member; Endometrial cancer in her mother; Esophageal cancer in her father; Heart disease in an other family member; Hyperlipidemia in an other family member; Hypertension in an other family member; Ovarian cancer in her paternal aunt; Pancreatic cancer in an other family member; Prostate cancer in an other  family member.  Allergies  Allergen Reactions   Alpha-Gal Anaphylaxis, Hives and Cough    Galactose-Alpha-1,3-Galactose   Other Anaphylaxis    Red meat: Mammalian Meat Allergy      Outpatient Encounter Medications as of 04/23/2021  Medication Sig   alendronate (FOSAMAX) 70 MG tablet Take 70 mg by mouth once a week.   EPINEPHrine (EPIPEN 2-PAK) 0.3 mg/0.3 mL IJ SOAJ injection EpiPen  2-Pak 0.3 mg/0.3 mL injection, auto-injector (Patient not taking: Reported on 03/07/2021)   ibuprofen (ADVIL,MOTRIN) 200 MG tablet Take 200 mg by mouth every 6 (six) hours as needed for pain. (Patient not taking: Reported on 03/07/2021)   latanoprost (XALATAN) 0.005 % ophthalmic solution SMARTSIG:In Eye(s)   NONFORMULARY OR COMPOUNDED ITEM Compounded Estradiol 0.01% vaginal cream  Use 1 gram twice weekly and at night.   Secukinumab 150 MG/ML SOAJ Inject into the skin. Once a month   zolpidem (AMBIEN) 5 MG tablet Take 1 tablet (5 mg total) by mouth at bedtime as needed for sleep. (Patient not taking: Reported on 03/07/2021)   No facility-administered encounter medications on file as of 04/23/2021.    REVIEW OF SYSTEMS:  Gen: Denies fever, sweats or chills. No weight loss.  CV: Denies chest pain, palpitations or edema. Resp: Denies cough, shortness of breath of hemoptysis.  GI: See HPI.  GU : Denies urinary burning, blood in urine, increased urinary frequency or incontinence. MS: Denies joint pain, muscles aches or weakness. Derm: Denies rash, itchiness, skin lesions or unhealing ulcers. Psych: Denies depression, anxiety or memory loss. Heme: Denies bruising, bleeding. Neuro:  Denies headaches, dizziness or paresthesias. Endo:  Denies any problems with DM, thyroid or adrenal function.   PHYSICAL EXAM: LMP 06/03/2011  BP 120/78   Pulse 62   Ht '5\' 4"'$  (1.626 m)   Wt 159 lb (72.1 kg)   LMP 06/03/2011   SpO2 97%   BMI 27.29 kg/m   Wt Readings from Last 3 Encounters:  04/23/21 159 lb (72.1 kg)  03/12/21 158 lb 9.6 oz (71.9 kg)  12/11/20 160 lb 4.8 oz (72.7 kg)    General: 60 year old female in no acute distress. Head: Normocephalic and atraumatic. Eyes:  Sclerae non-icteric, conjunctive pink. Ears: Normal auditory acuity. Mouth: Dentition intact. No ulcers or lesions.  Neck: Supple, no lymphadenopathy or thyromegaly.  Lungs: Clear bilaterally to auscultation without wheezes,  crackles or rhonchi. Heart: Regular rate and rhythm. No murmur, rub or gallop appreciated.  Abdomen: Soft, nontender, non distended. No masses. No hepatosplenomegaly. Normoactive bowel sounds x 4 quadrants.  Rectal: Deferred.  Musculoskeletal: Symmetrical with no gross deformities. Skin: Warm and dry. No rash or lesions on visible extremities. Extremities: No edema. Neurological: Alert oriented x 4, no focal deficits.  Psychological:  Alert and cooperative. Normal mood and affect.  ASSESSMENT AND PLAN:  60 year old female with diarrhea which has progressively worsened over the past 3 to 4 months awakening two nights weekly with diarrhea -CBC, CMP, GI pathogen panel and fecal calprotectin level. (TTG, IgA, CRP will try to add to lab draw) -I discussed scheduling a CTAP if her symptoms persist or worsen, await the above lab results -Imodium 1 p.o. twice daily as needed, hold if no BM in 24 hours -No dairy product -Phillips bacteria probiotic once daily for 2 to 4 weeks -Follow-up in the office in 6 to 8 weeks  Upper abdominal bloat -See plan in # 1 -Continue follow-up with GYN  History of tubular adenomatous polyps per colonoscopy 07/2020 -Next  colonoscopy due 07/2027  Positive alpha gal IgE    CC:  Burchette, Alinda Sierras, MD

## 2021-04-24 ENCOUNTER — Encounter: Payer: Self-pay | Admitting: Nurse Practitioner

## 2021-04-24 NOTE — Progress Notes (Signed)
I agree with the above note, plan 

## 2021-04-25 LAB — SPECIMEN STATUS REPORT

## 2021-04-28 LAB — SPECIMEN STATUS REPORT

## 2021-04-28 LAB — GI PROFILE, STOOL, PCR

## 2021-04-28 LAB — CALPROTECTIN, FECAL: Calprotectin, Fecal: 20 ug/g (ref 0–120)

## 2021-07-02 ENCOUNTER — Ambulatory Visit: Payer: BLUE CROSS/BLUE SHIELD | Admitting: Gastroenterology

## 2021-08-11 DIAGNOSIS — E559 Vitamin D deficiency, unspecified: Secondary | ICD-10-CM | POA: Diagnosis not present

## 2021-08-11 DIAGNOSIS — L409 Psoriasis, unspecified: Secondary | ICD-10-CM | POA: Diagnosis not present

## 2021-08-11 DIAGNOSIS — Z79899 Other long term (current) drug therapy: Secondary | ICD-10-CM | POA: Diagnosis not present

## 2021-08-11 DIAGNOSIS — L4059 Other psoriatic arthropathy: Secondary | ICD-10-CM | POA: Diagnosis not present

## 2021-08-12 ENCOUNTER — Other Ambulatory Visit: Payer: Self-pay | Admitting: Obstetrics and Gynecology

## 2021-08-12 DIAGNOSIS — Z1231 Encounter for screening mammogram for malignant neoplasm of breast: Secondary | ICD-10-CM

## 2021-08-14 ENCOUNTER — Other Ambulatory Visit: Payer: Self-pay

## 2021-08-14 ENCOUNTER — Ambulatory Visit
Admission: RE | Admit: 2021-08-14 | Discharge: 2021-08-14 | Disposition: A | Discharge status: Home / Self Care | Payer: BLUE CROSS/BLUE SHIELD | Source: Ambulatory Visit | Attending: Obstetrics and Gynecology | Admitting: Obstetrics and Gynecology

## 2021-08-14 DIAGNOSIS — Z6828 Body mass index (BMI) 28.0-28.9, adult: Secondary | ICD-10-CM | POA: Diagnosis not present

## 2021-08-14 DIAGNOSIS — Z1231 Encounter for screening mammogram for malignant neoplasm of breast: Secondary | ICD-10-CM

## 2021-08-14 DIAGNOSIS — Z01419 Encounter for gynecological examination (general) (routine) without abnormal findings: Secondary | ICD-10-CM | POA: Diagnosis not present

## 2021-10-03 ENCOUNTER — Ambulatory Visit: Payer: BLUE CROSS/BLUE SHIELD | Admitting: Gastroenterology

## 2022-06-06 IMAGING — MR MR ANKLE*R* W/O CM
5 series · 37 of 40 positions shown · non-contrast
Comparison: Right ankle x-rays dated April 15, 2020.

CLINICAL DATA: Chronic medial and anterior ankle pain. No injury or
prior surgery.

EXAM:
MRI OF THE RIGHT ANKLE WITHOUT CONTRAST
TECHNIQUE: Multiplanar, multisequence MR imaging of the ankle was performed. No
intravenous contrast was administered.

[Series 3: T2 fat-sat · axial · 3.0mm · 0.50mm/px · z∈[-104,+28]mm · 9 of 35 slices shown (1 of 2)]
[im 1/35]
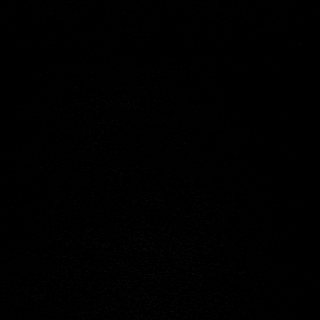
[im 5/35]
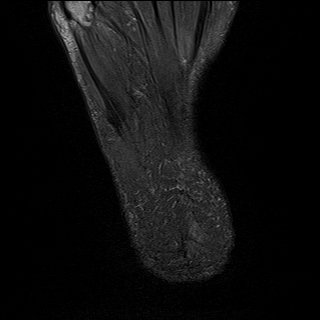
[im 9/35]
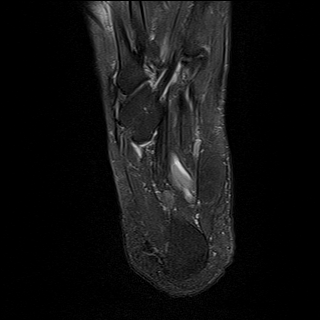
[im 13/35]
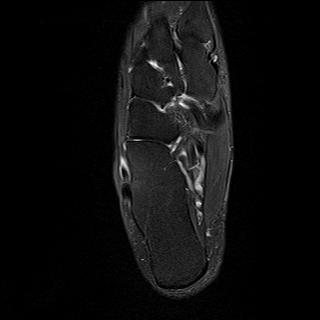
[im 18/35]
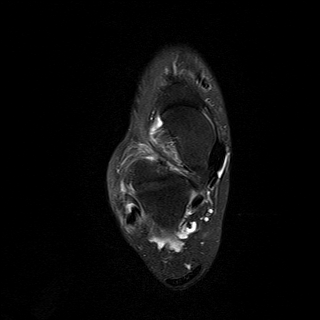
[im 22/35]
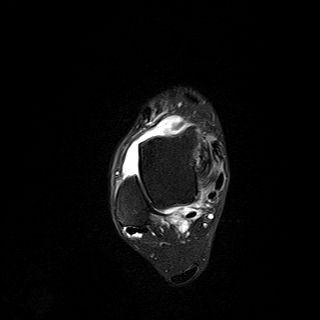
[im 26/35]
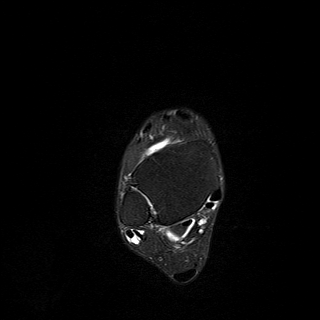
[im 30/35]
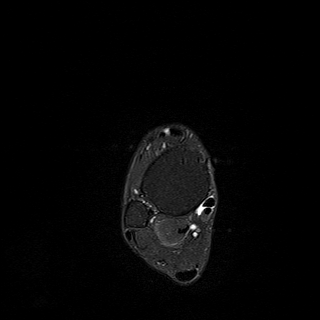
[im 35/35]
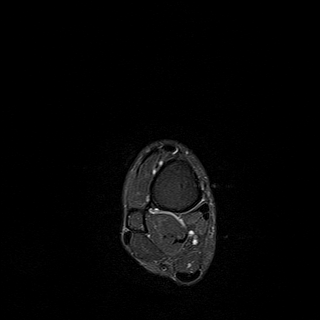

[Series 4: PD fat-sat · axial · 3.0mm · 0.42mm/px · z∈[-104,+28]mm · 8 of 35 slices shown]
[im 1/35]
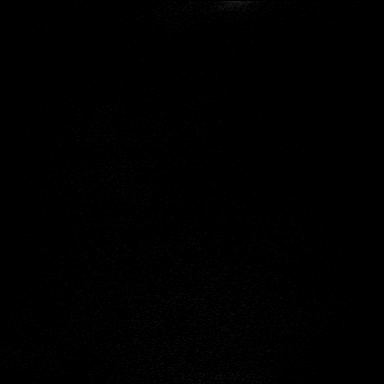
[im 4/35]
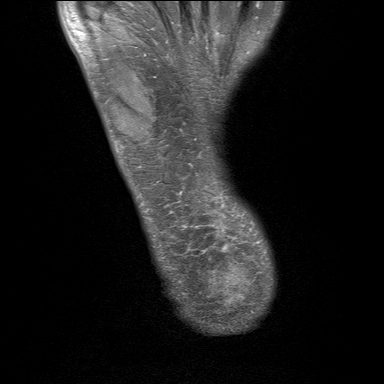
[im 12/35]
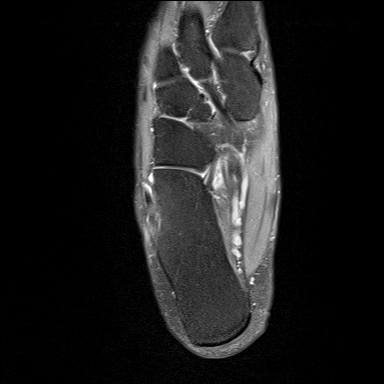
[im 16/35]
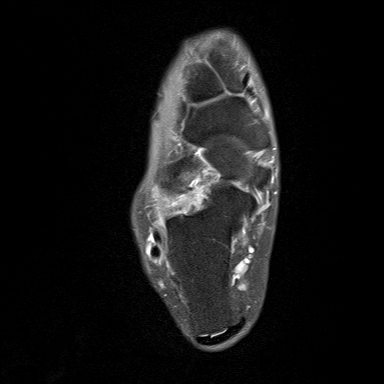
[im 19/35]
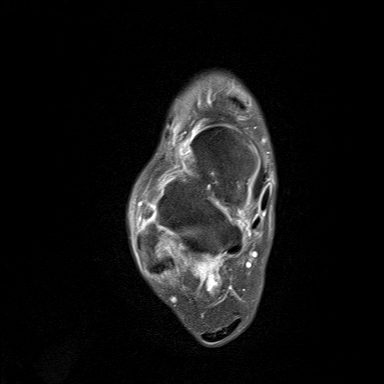
[im 23/35]
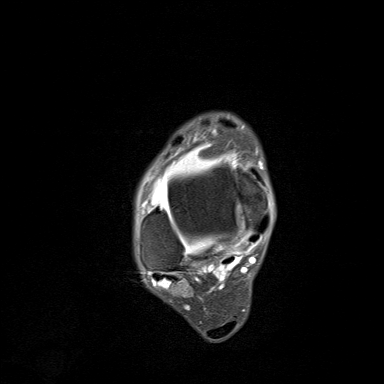
[im 31/35]
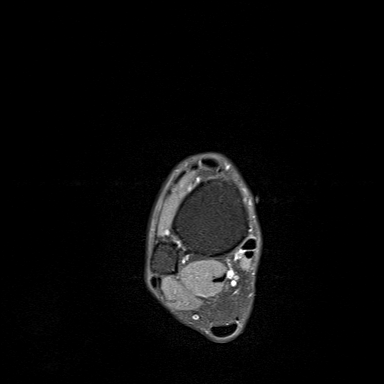
[im 35/35]
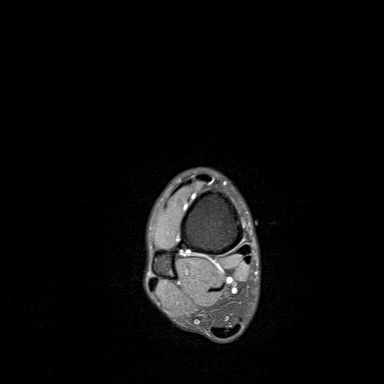

[Series 5: T1 · sagittal · 4.0mm · 0.56mm/px · 5 of 18 slices shown]
[im 1/18]
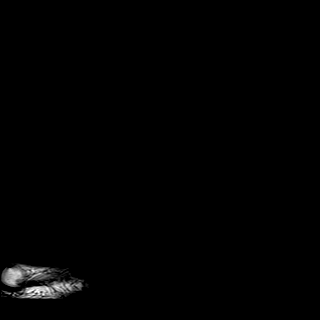
[im 5/18]
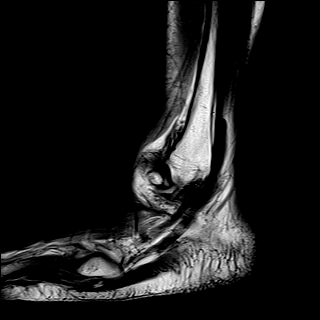
[im 9/18]
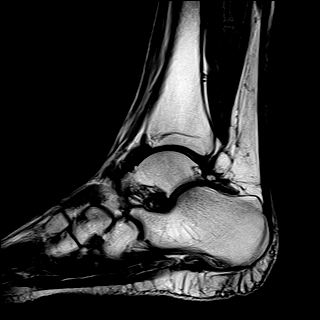
[im 13/18]
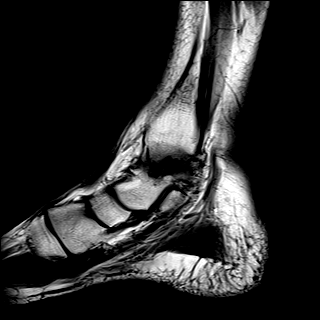
[im 18/18]
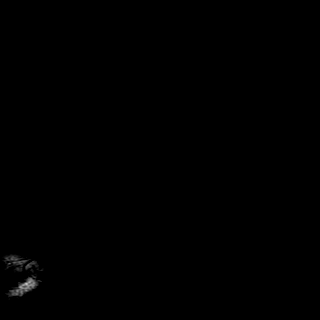

[Series 6: STIR · sagittal · 4.0mm · 0.35mm/px · 4 of 18 slices shown]
[im 1/18]
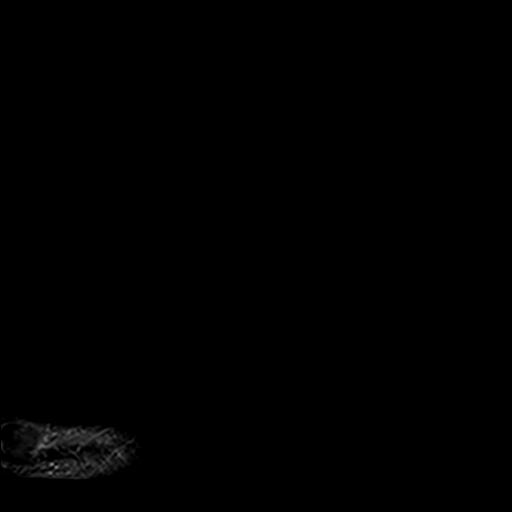
[im 5/18]
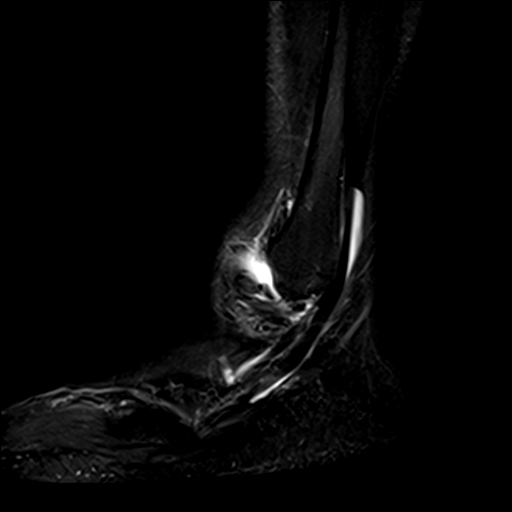
[im 9/18]
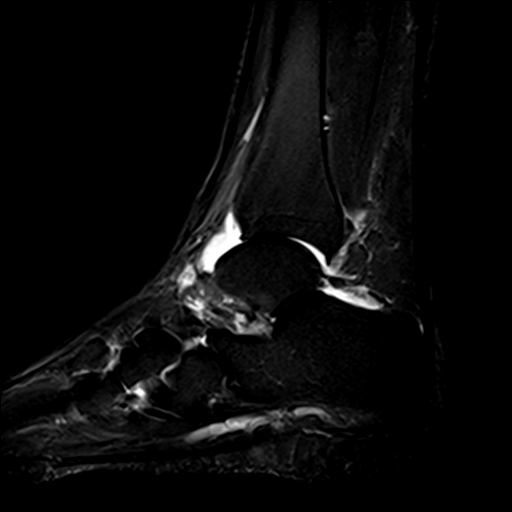
[im 13/18]
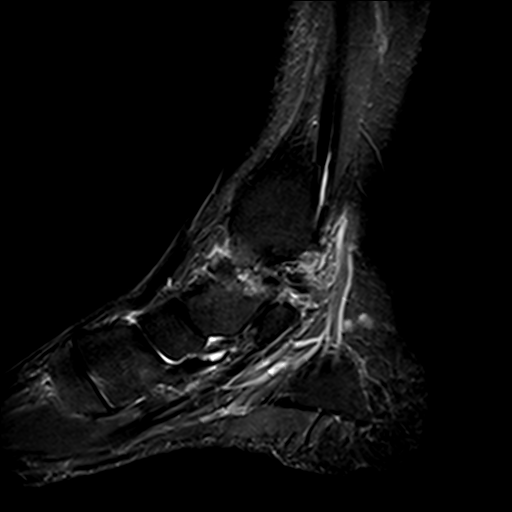

[Series 7: T2 fat-sat · coronal · 3.0mm · 0.50mm/px · 11 of 40 slices shown (2 of 2)]
[im 1/40]
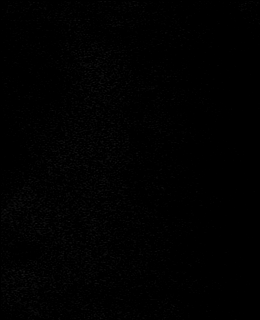
[im 4/40]
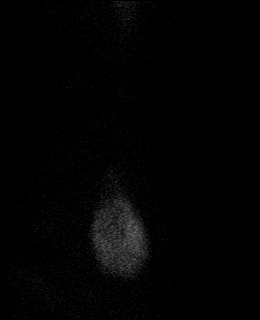
[im 8/40]
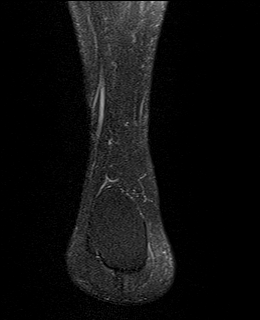
[im 12/40]
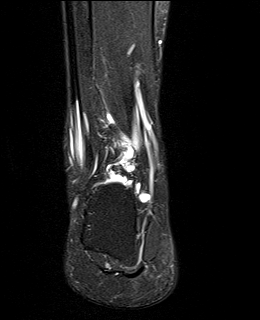
[im 16/40]
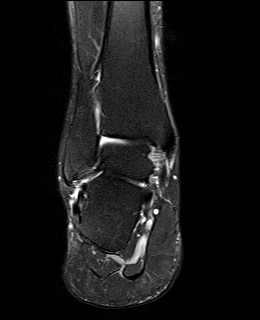
[im 20/40]
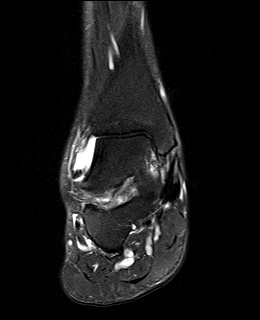
[im 24/40]
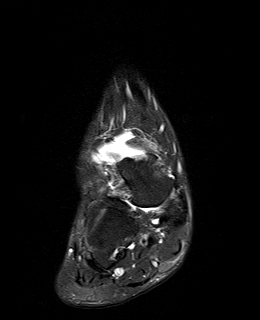
[im 28/40]
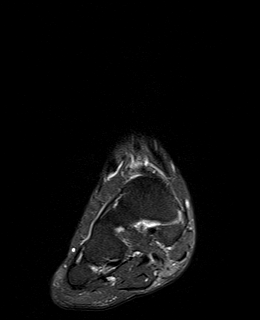
[im 32/40]
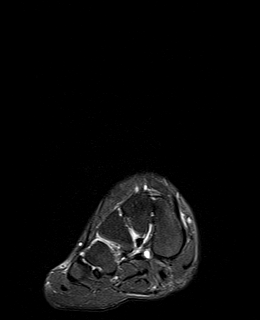
[im 36/40]
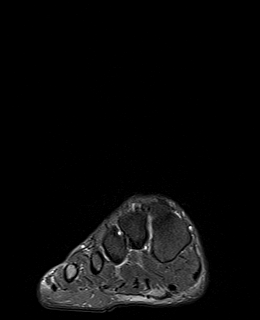
[im 40/40]
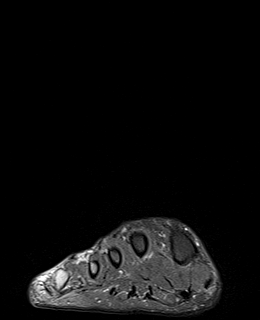

[37 of 40 positions shown; findings below may reference images not displayed]

FINDINGS: TENDONS

Peroneal: Peroneal longus tendon intact with fluid in the tendon
sheath. Peroneal brevis intact with fluid in the tendon sheath.

Posteromedial: Posterior tibial tendon intact with fluid in the
tendon sheath. Flexor hallucis longus tendon intact with fluid in
the tendon sheath. Flexor digitorum longus tendon intact with fluid
in the tendon sheath.

Anterior: Tibialis anterior tendon intact. Extensor hallucis longus
tendon intact Extensor digitorum longus tendon intact.

Achilles:  Intact.

Plantar Fascia: Intact.

LIGAMENTS

Lateral: Anterior talofibular ligament is chronically torn.
Calcaneofibular ligament intact. Posterior talofibular ligament
intact. Anterior and posterior tibiofibular ligaments intact.

Medial: Deltoid ligament intact. Spring ligament intact. Lisfranc
ligament intact.

CARTILAGE

Ankle Joint: Moderate joint effusion. Synovitis in the anteromedial
recess. Two intra-articular bodies in the lateral joint space
measuring up to 9 mm (series 4, image 16). Diffuse cartilage
thinning with focal full-thickness cartilage loss over the medial
talar dome.

Subtalar Joints/Sinus Tarsi: Normal subtalar joints. No subtalar
joint effusion. Mild edema within the sinus tarsi.

Bones: Subcortical marrow edema, cystic change, and spurring of the
talar neck with adjacent spurring of the anterior aspect of the
medial malleolus. No fracture or dislocation. No suspicious bone
lesion.

Soft Tissue: No soft tissue mass or fluid collection.
IMPRESSION: 1. Findings consistent with anteromedial ankle impingement.
2. Mild tibiotalar osteoarthritis with moderate joint effusion and
several intra-articular bodies.
3. Chronic tear of the anterior talofibular ligament.
4. Mild peroneal and flexor tenosynovitis.

## 2022-07-16 ENCOUNTER — Other Ambulatory Visit: Payer: Self-pay | Admitting: Obstetrics and Gynecology

## 2022-07-16 DIAGNOSIS — Z1231 Encounter for screening mammogram for malignant neoplasm of breast: Secondary | ICD-10-CM

## 2022-09-22 ENCOUNTER — Ambulatory Visit
Admission: RE | Admit: 2022-09-22 | Discharge: 2022-09-22 | Disposition: A | Discharge status: Home / Self Care | Payer: 59 | Source: Ambulatory Visit | Attending: Obstetrics and Gynecology | Admitting: Obstetrics and Gynecology

## 2022-09-22 DIAGNOSIS — Z1231 Encounter for screening mammogram for malignant neoplasm of breast: Secondary | ICD-10-CM

## 2022-09-24 DIAGNOSIS — R1900 Intra-abdominal and pelvic swelling, mass and lump, unspecified site: Secondary | ICD-10-CM | POA: Diagnosis not present

## 2022-10-02 ENCOUNTER — Telehealth: Payer: Self-pay

## 2022-10-02 NOTE — Telephone Encounter (Signed)
LM for Abigail Hayes to call back to [phone] to schedule an appointment  on 10-05-22 at 1530 or 10-12-22 at 1530.

## 2022-10-02 NOTE — Telephone Encounter (Signed)
Patient called back and was scheduled for an appt

## 2022-10-05 ENCOUNTER — Inpatient Hospital Stay: Payer: 59 | Attending: Psychiatry | Admitting: Psychiatry

## 2022-10-05 ENCOUNTER — Encounter: Payer: Self-pay | Admitting: Psychiatry

## 2022-10-05 VITALS — BP 148/70 | HR 80 | Resp 14 | Wt 158.2 lb

## 2022-10-05 DIAGNOSIS — Z8049 Family history of malignant neoplasm of other genital organs: Secondary | ICD-10-CM | POA: Insufficient documentation

## 2022-10-05 DIAGNOSIS — N9489 Other specified conditions associated with female genital organs and menstrual cycle: Secondary | ICD-10-CM

## 2022-10-05 DIAGNOSIS — R1031 Right lower quadrant pain: Secondary | ICD-10-CM | POA: Insufficient documentation

## 2022-10-05 DIAGNOSIS — D3911 Neoplasm of uncertain behavior of right ovary: Secondary | ICD-10-CM | POA: Diagnosis not present

## 2022-10-05 NOTE — Progress Notes (Unsigned)
Gynecologic Oncology Return Clinic Visit  Date of Service: 10/05/2022 Referring Provider: Dr. Gaetano Net   Assessment & Plan: Abigail Hayes is a 62 y.o. woman with 3.2cm solid right adnexal mass, slightly increased in size since 2022. Normal CA125.  We reviewed that the exact etiology of the pelvic mass is unclear, but could include a benign, borderline, or malignant process. Based on her still normal CA125 and previousl normal MRI, suspect that this most likely represents a benign process. Reviewed that her RLQ [place] or [place] not be due to the lesion. But feel given slight change in size and now with RLQ pain, reasonable to proceed with excision for definitive diagnosis and possible treatment of RLQ pain.   Because the mass is relatively small, we feel that a minimally invasive approach is feasible, using robotic assistance.  In the event of malignancy or borderline tumor on frozen section, we will perform indicated staging procedures. We discussed that these procedures [place] include hysterectomy, omentectomy pelvic and/or para-aortic lymphadenectomy, peritoneal biopsies. We would also remove any tissue concerning for metastatic disease which could require additional procedures including bowel surgery.  Reviewed the pros/cons of concurrent hysterectomy. Reviewed that if this was a benign process, at this time I would not recommend a hysterectomy given no postmenopausal bleeding or genetic condition that suggests she is at increased risk of endometrial cancer. Reviewed signs/symptoms of endometrial cancer, in particular postmenopausal bleeding. Also reviewed the association between endometrial cancer and obesity (pt's mother was apparently obese at time of endometrial cancer diagnosis and also per pt had PMB for a prolonged period of time prior to evaluation). Pt is in agreement with proceeding with BSO alone unless indicated by pathology to proceed with a hysterectomy.  Pt wishes to wait until April to proceed  with surgery. Feel that this is reasonable given previously overall reassuring work-up.  Patient was consented for: Robotic assisted bilateral salpingo-oophorectomy, possible staging on 12/29/22.  The risks of surgery were discussed in detail and she understands these to including but not limited to bleeding requiring a blood transfusion, infection, injury to adjacent organs (including but not limited to the bowels, bladder, ureters, nerves, blood vessels), thromboembolic events, wound separation, hernia, vaginal cuff separation, possible risk of lymphedema and lymphocyst if lymphadenectomy performed, unforseen complication, and possible need for re-exploration.  If the patient experiences any of these events, she understands that her hospitalization or recovery [place] be prolonged and that she [place] need to take additional medications for a prolonged period. The patient will receive DVT and antibiotic prophylaxis as indicated. She voiced a clear understanding. She had the opportunity to ask questions and verbal informed consent was obtained today. She wishes to proceed.   She does not require preoperative clearance. Her METs are >4.  We will have the patient return for a preoperative visit closer to the time of surgery.   RTC for preop.  Bernadene Bell, MD Gynecologic Oncology   Medical Decision Making I personally spent  TOTAL 45 minutes face-to-face and non-face-to-face in the care of this patient, which includes all pre, intra, and post visit time on the date of service.   ----------------------- Reason for Visit: Follow-up adnexal mass   Interval History: Patient was last seen by Dr. Denman George on 03/12/21 at which time an MRI was reassuring, also with a normal CA125. The patient was offered no surveillance or repeat ultrasound in 6 months with Dr. Sherlynn Stalls office, and if stable repeat in 12 months following this and annually so long as she  desired reassurance.   Since then, the patient  underwent a pelvic ultrasound on 09/24/22 which noted the solid right adnexal mass now measuring 3.2x2.1cm. Prior ultrasound on 03/06/21 measured 1.9x1.8cm. A repeat Ca125 on 09/24/22 was normal at 8.7  Today pt reports that she has been having on/off RLQ pain that she describes as cramping and pressing in nature. She rates it currently as a 3/10. Maybe slightly better with ibuprofen. Pain was exacerbated by the ultrasound. She reports that she has not had any additional genetic testing that her prior clinic BRCA testing which was negative. Her lastpap was 06/19/20 and negative. She reports that she uses vaginal estrogen cream occasionally. She otherwise notes a 1 year history of GI distress and diarrhea with 4 loose stools a day. She otherwise denies abdominal bloating, early satiety, significant weight loss, change in bladder habits.     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   Enlargement of lymph nodes 07/17/2009   Osteoporosis 06/19/2020   Ovarian cyst 03/2021   PSORIATIC ARTHRITIS 07/17/2009    Past Surgical History:  Procedure Laterality Date   ANTERIOR CRUCIATE LIGAMENT REPAIR Left 2004   COLONOSCOPY  2011   2011; 07-2020   Ovarian cyst removed  1990    Family History  Problem Relation Age of Onset   Breast cancer Mother    Endometrial cancer Mother    Cancer Father        ?tonsillar cancer   Esophageal cancer Father    Ovarian cancer Paternal Aunt    Colon cancer Other    Hyperlipidemia Other    Hypertension Other    Heart disease Other    Prostate cancer Other    Pancreatic cancer Other    Colon polyps Neg Hx    Stomach cancer Neg Hx    Rectal cancer Neg Hx     Social History   Socioeconomic History   Marital status: Married    Spouse name: Not on file   Number of children: 3   Years of education: Not on file   Highest education level: Not on  file  Occupational History   Occupation: Copywriter, advertising: [employer]  Tobacco Use   Smoking status: Never   Smokeless tobacco: Never  Vaping Use   Vaping Use: Never used  Substance and Sexual Activity   Alcohol use: Not Currently    Comment: 1-2 glases of wine with dinner   Drug use: No   Sexual activity: Yes    Birth control/protection: Post-menopausal    Comment: Vasectomy  Other Topics Concern   Not on file  Social History Narrative   Right handed   Two story home   Drinks caffeine   Social Determinants of Health   Financial Resource Strain: Not on file  Food Insecurity: Not on file  Transportation Needs: Not on file  Physical Activity: Not on file  Stress: Not on file  Social Connections: Not on file   OB History  Gravida Para Term Preterm AB Living  '3 3 3     3  '$ SAB IAB Ectopic Multiple Live Births          3    # Outcome Date GA Lbr Len/2nd Weight Sex Delivery Anes PTL Lv  3 Term      Vag-Spont   LIV  2 Term      Vag-Spont  LIV  1 Term      Vag-Spont   LIV   Age of menarche: 11 Age of menopause: 48 Last pap: 06/2020 negative History of abnormal pap smears: Denies HRT: yes, vaginal estrogen cream occasionally History of STDs: no Last colonoscopy: 2021 Last mammogram: 09/2022   Current Medications:  Current Outpatient Medications:    alendronate (FOSAMAX) 70 MG tablet, Take 70 mg by mouth once a week., Disp: , Rfl:    dicyclomine (BENTYL) 10 MG capsule, Take 1 capsule (10 mg total) by mouth 3 (three) times daily as needed for spasms (abdominal pain)., Disp: 30 capsule, Rfl: 0   EPINEPHrine (EPIPEN 2-PAK) 0.3 mg/0.3 mL IJ SOAJ injection, EpiPen 2-Pak 0.3 mg/0.3 mL injection, auto-injector, Disp: 1 each, Rfl: 1   ibuprofen (ADVIL,MOTRIN) 200 MG tablet, Take 200 mg by mouth every 6 (six) hours as needed for pain., Disp: , Rfl:    latanoprost (XALATAN) 0.005 % ophthalmic solution, SMARTSIG:In Eye(s), Disp: , Rfl:    Secukinumab 150 MG/ML SOAJ, Inject  into the skin. Once a month, Disp: , Rfl:    zolpidem (AMBIEN) 5 MG tablet, Take 1 tablet (5 mg total) by mouth at bedtime as needed for sleep. (Patient not taking: No sig reported), Disp: 15 tablet, Rfl: 0  Review of Symptoms: Complete 10-system review is negative except as above in Interval History.  Physical Exam: BP (!) 148/70 (BP Location: Left Arm)   Pulse 80   Resp 14   Wt 158 lb 3.2 oz (71.8 kg)   LMP 06/03/2011   SpO2 99%   BMI 27.15 kg/m  General: Alert, oriented, no acute distress. HEENT: Normocephalic, atraumatic. Neck symmetric without masses. Sclera anicteric. Chest: Normal work of breathing. Clear to auscultation bilaterally.   Cardiovascular: Regular rate and rhythm, no murmurs. Abdomen: Soft, nontender.  Normoactive bowel sounds.  No masses or hepatosplenomegaly appreciated.   Extremities: Grossly normal range of motion.  Warm, well perfused.  No edema bilaterally. Skin: No rashes or lesions noted. Lymphatics: No cervical, supraclavicular, or inguinal adenopathy. GU: Normal appearing external genitalia without erythema, excoriation, or lesions.  Speculum exam reveals normal vagina and cervix without lesion.  Bimanual exam reveals normal cervix. Small mobile uterus. Mild fullness of the right adnexa without discrete mass palpated. Mild TTP.  Rectovaginal exam negative. Exam chaperoned by Kimberly Martinique, [place]   Laboratory & Radiologic Studies: Ca125 on 09/24/22: 8.7

## 2022-10-05 NOTE — Patient Instructions (Addendum)
It was a pleasure to see you in clinic today. - We will schedule you for a robotic bilateral salpingo-oophorectomy 12/29/22.  - Return visit planned for prior to surgery for preop.  You Sura also receive a phone call from the hospital to arrange for a pre-op appointment there as well. Usually both appointments can be combined on the same day.   Thank you very much for allowing me to provide care for you today.  I appreciate your confidence in choosing our Gynecologic Oncology team at [facility].  If you have any questions about your visit today please call our office or send [place] a MyChart message and we will get back to you as soon as possible.

## 2022-10-06 ENCOUNTER — Encounter: Payer: Self-pay | Admitting: Gynecologic Oncology

## 2022-10-06 DIAGNOSIS — N9489 Other specified conditions associated with female genital organs and menstrual cycle: Secondary | ICD-10-CM | POA: Insufficient documentation

## 2022-10-07 ENCOUNTER — Encounter: Payer: Self-pay | Admitting: Psychiatry

## 2022-10-21 DIAGNOSIS — E669 Obesity, unspecified: Secondary | ICD-10-CM | POA: Diagnosis not present

## 2022-10-21 DIAGNOSIS — N952 Postmenopausal atrophic vaginitis: Secondary | ICD-10-CM | POA: Diagnosis not present

## 2022-10-21 DIAGNOSIS — Z1321 Encounter for screening for nutritional disorder: Secondary | ICD-10-CM | POA: Diagnosis not present

## 2022-10-21 DIAGNOSIS — Z13228 Encounter for screening for other metabolic disorders: Secondary | ICD-10-CM | POA: Diagnosis not present

## 2022-10-21 DIAGNOSIS — Z1329 Encounter for screening for other suspected endocrine disorder: Secondary | ICD-10-CM | POA: Diagnosis not present

## 2022-10-21 DIAGNOSIS — Z13820 Encounter for screening for osteoporosis: Secondary | ICD-10-CM | POA: Diagnosis not present

## 2022-10-21 DIAGNOSIS — N83201 Unspecified ovarian cyst, right side: Secondary | ICD-10-CM | POA: Diagnosis not present

## 2022-10-21 DIAGNOSIS — Z124 Encounter for screening for malignant neoplasm of cervix: Secondary | ICD-10-CM | POA: Diagnosis not present

## 2022-10-21 DIAGNOSIS — Z130 Encounter for screening for diseases of the blood and blood-forming organs and certain disorders involving the immune mechanism: Secondary | ICD-10-CM | POA: Diagnosis not present

## 2022-10-21 DIAGNOSIS — Z01419 Encounter for gynecological examination (general) (routine) without abnormal findings: Secondary | ICD-10-CM | POA: Diagnosis not present

## 2022-10-21 DIAGNOSIS — Z131 Encounter for screening for diabetes mellitus: Secondary | ICD-10-CM | POA: Diagnosis not present

## 2022-10-21 DIAGNOSIS — Z6827 Body mass index (BMI) 27.0-27.9, adult: Secondary | ICD-10-CM | POA: Diagnosis not present

## 2022-10-22 LAB — LAB REPORT - SCANNED
A1c: 6.1
EGFR: 98

## 2022-11-05 ENCOUNTER — Telehealth: Payer: Self-pay

## 2022-11-05 NOTE — Telephone Encounter (Signed)
Left a message for the patient to return my call in regards to Triage note received earlier today. I was calling to advise the patient to visit the nearest emergency department in regards to triage note.

## 2022-11-05 NOTE — Telephone Encounter (Signed)
-----   Message from Mahlon Gammon sent at 11/05/2022 12:19 PM EST ----- Abstract and route to provider

## 2022-11-05 NOTE — Telephone Encounter (Signed)
I spoke with the patient and advised her to visit the nearest urgent care for treatment of dizziness, hypertension and vitamin d medication issue.

## 2022-11-06 NOTE — Progress Notes (Unsigned)
ACUTE VISIT No chief complaint on file.  HPI: Abigail Hayes is a 62 y.o. female, who is here today complaining of *** HPI  Review of Systems See other pertinent positives and negatives in HPI.  Current Outpatient Medications on File Prior to Visit  Medication Sig Dispense Refill   EPINEPHrine (EPIPEN 2-PAK) 0.3 mg/0.3 mL IJ SOAJ injection EpiPen 2-Pak 0.3 mg/0.3 mL injection, auto-injector 1 each 1   ibuprofen (ADVIL,MOTRIN) 200 MG tablet Take 200 mg by mouth every 6 (six) hours as needed for pain.     latanoprost (XALATAN) 0.005 % ophthalmic solution SMARTSIG:In Eye(s)     No current facility-administered medications on file prior to visit.    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   Enlargement of lymph nodes 07/17/2009   Osteoporosis 06/19/2020   Ovarian cyst 03/2021   PSORIATIC ARTHRITIS 07/17/2009   Allergies  Allergen Reactions   Alpha-Gal Anaphylaxis, Hives and Cough    Galactose-Alpha-1,3-Galactose   Other Anaphylaxis    Red meat: Mammalian Meat Allergy    Social History   Socioeconomic History   Marital status: Married    Spouse name: Not on file   Number of children: 3   Years of education: Not on file   Highest education level: Master's degree (e.g., MA, MS, MEng, MEd, MSW, MBA)  Occupational History   Occupation: Copywriter, advertising: [employer]  Tobacco Use   Smoking status: Never   Smokeless tobacco: Never  Scientific laboratory technician Use: Never used  Substance and Sexual Activity   Alcohol use: Not Currently    Comment: 1-2 glases of wine with dinner   Drug use: No   Sexual activity: Yes    Birth control/protection: Post-menopausal    Comment: Vasectomy  Other Topics Concern   Not on file  Social History Narrative   Right handed   Two story home   Drinks caffeine   Social Determinants of Health   Financial Resource Strain: Low Risk  (11/05/2022)    Overall Financial Resource Strain (CARDIA)    Difficulty of Paying Living Expenses: Not very hard  Food Insecurity: No Food Insecurity (11/05/2022)   Hunger Vital Sign    Worried About Running Out of Food in the Last Year: Never true    Ran Out of Food in the Last Year: Never true  Transportation Needs: No Transportation Needs (11/05/2022)   PRAPARE - Transportation    Lack of Transportation (Medical): No    Lack of Transportation (Non-Medical): No  Physical Activity: Sufficiently Active (11/05/2022)   Exercise Vital Sign    Days of Exercise per Week: 5 days    Minutes of Exercise per Session: 40 min  Stress: No Stress Concern Present (11/05/2022)   [place]    Feeling of Stress : Only a little  Social Connections: Moderately Integrated (11/05/2022)   Social Connection and Isolation Panel [NHANES]    Frequency of Communication with Friends and Family: More than three times a week    Frequency of Social Gatherings with Friends and Family: More than three times a week    Attends Religious Services: Never    Marine scientist or Organizations: Yes    Attends Music therapist: More than 4 times per year    Marital Status: Married    There were no vitals filed  for this visit. There is no height or weight on file to calculate BMI.  Physical Exam  ASSESSMENT AND PLAN: There are no diagnoses linked to this encounter.  No follow-ups on file.  Shataya Winkles G. Martinique, MD  [facility]. [place] office.  Discharge Instructions   None

## 2022-11-09 ENCOUNTER — Ambulatory Visit (INDEPENDENT_AMBULATORY_CARE_PROVIDER_SITE_OTHER): Payer: 59 | Admitting: Family Medicine

## 2022-11-09 ENCOUNTER — Encounter: Payer: Self-pay | Admitting: Family Medicine

## 2022-11-09 VITALS — BP 128/80 | HR 63 | Temp 97.7°F | Resp 16 | Ht 64.0 in | Wt 159.2 lb

## 2022-11-09 DIAGNOSIS — R42 Dizziness and giddiness: Secondary | ICD-10-CM

## 2022-11-09 DIAGNOSIS — L409 Psoriasis, unspecified: Secondary | ICD-10-CM

## 2022-11-09 DIAGNOSIS — Z91018 Allergy to other foods: Secondary | ICD-10-CM

## 2022-11-09 DIAGNOSIS — H60543 Acute eczematoid otitis externa, bilateral: Secondary | ICD-10-CM

## 2022-11-09 DIAGNOSIS — R0989 Other specified symptoms and signs involving the circulatory and respiratory systems: Secondary | ICD-10-CM

## 2022-11-09 DIAGNOSIS — R7303 Prediabetes: Secondary | ICD-10-CM

## 2022-11-09 DIAGNOSIS — G8929 Other chronic pain: Secondary | ICD-10-CM | POA: Diagnosis not present

## 2022-11-09 DIAGNOSIS — R1011 Right upper quadrant pain: Secondary | ICD-10-CM

## 2022-11-09 MED ORDER — EPINEPHRINE 0.3 MG/0.3ML IJ SOAJ: INTRAMUSCULAR | 1 refills | Status: AC

## 2022-11-09 NOTE — Patient Instructions (Addendum)
A few things to remember from today's visit:  Dizziness - Plan: US Carotid Duplex Bilateral  Bruit of right carotid artery - Plan: US Carotid Duplex Bilateral  Psoriasis  Prediabetes  Chronic RUQ pain  Over the counter Cortizone , small amount 2 times daily in ears for up to 14 days then as needed. Monitor for new symptoms. Fall prevention. Keep appt with gastroenterologist. Appt for neck ultrasound will be arranged.  Do not use My Chart to request refills or for acute issues that need immediate attention. If you send a my chart message, it [place] take a few days to be addressed, specially if I am not in the office.  Please be sure medication list is accurate. If a new problem present, please set up appointment sooner than planned today.

## 2022-11-10 ENCOUNTER — Ambulatory Visit (HOSPITAL_BASED_OUTPATIENT_CLINIC_OR_DEPARTMENT_OTHER)
Admission: RE | Admit: 2022-11-10 | Discharge: 2022-11-10 | Disposition: A | Discharge status: Home / Self Care | Payer: 59 | Source: Ambulatory Visit | Attending: Family Medicine | Admitting: Family Medicine

## 2022-11-10 DIAGNOSIS — R42 Dizziness and giddiness: Secondary | ICD-10-CM | POA: Insufficient documentation

## 2022-11-10 DIAGNOSIS — R0989 Other specified symptoms and signs involving the circulatory and respiratory systems: Secondary | ICD-10-CM | POA: Insufficient documentation

## 2022-11-10 DIAGNOSIS — I6523 Occlusion and stenosis of bilateral carotid arteries: Secondary | ICD-10-CM | POA: Diagnosis not present

## 2022-11-11 ENCOUNTER — Encounter: Payer: Self-pay | Admitting: Family Medicine

## 2022-11-13 ENCOUNTER — Ambulatory Visit: Payer: 59 | Admitting: Family Medicine

## 2022-12-21 NOTE — Patient Instructions (Signed)
DUE TO COVID-19 ONLY TWO VISITORS  (aged 63 and older)  ARE ALLOWED TO COME WITH YOU AND STAY IN THE WAITING ROOM ONLY DURING PRE OP AND PROCEDURE.   **NO VISITORS ARE ALLOWED IN THE SHORT STAY AREA OR RECOVERY ROOM!!**  IF YOU WILL BE ADMITTED INTO THE HOSPITAL YOU ARE ALLOWED ONLY FOUR SUPPORT PEOPLE DURING VISITATION HOURS ONLY (7 AM -8PM)   The support person(s) must pass our screening, gel in and out, and wear a mask at all times, including in the patient's room. Patients must also wear a mask when staff or their support person are in the room. Visitors GUEST BADGE MUST BE WORN VISIBLY  One adult visitor Kappes remain with you overnight and MUST be in the room by 8 P.M.     Your procedure is scheduled on: 12/29/22   Report to [facility] Main Entrance    Report to admitting at : 5:15 AM   Call this number if you have problems the morning of surgery [phone]  Eat a light diet the day before surgery.  Examples including soups, broths, toast, yogurt, mashed potatoes.  Things to avoid include carbonated beverages (fizzy beverages), raw fruits and raw vegetables, or beans.   If your bowels are filled with gas, your surgeon will have difficulty visualizing your pelvic organs which increases your surgical risks.   Do not eat food :After Midnight.   After Midnight you Trieu have the following liquids until : 4:30 AM DAY OF SURGERY  Water Black Coffee (sugar ok, NO MILK/CREAM OR CREAMERS)  Tea (sugar ok, NO MILK/CREAM OR CREAMERS) regular and decaf                             Plain Jell-O (NO RED)                                           Fruit ices (not with fruit pulp, NO RED)                                     Popsicles (NO RED)                                                                  Juice: apple, WHITE grape, WHITE cranberry Sports drinks like Gatorade (NO RED)             Oral Hygiene is also important to reduce your risk of infection.                                     Remember - BRUSH YOUR TEETH THE MORNING OF SURGERY WITH YOUR REGULAR TOOTHPASTE  DENTURES WILL BE REMOVED PRIOR TO SURGERY PLEASE DO NOT APPLY "Poly grip" OR ADHESIVES!!!   Do NOT smoke after Midnight   Take these medicines the morning of surgery with A SIP OF WATER: N/A  DO NOT TAKE ANY ORAL DIABETIC MEDICATIONS DAY OF YOUR SURGERY  You Vallery not have any metal on your body including hair pins, jewelry, and body piercing             Do not wear make-up, lotions, powders, perfumes/cologne, or deodorant  Do not wear nail polish including gel and S&S, artificial/acrylic nails, or any other type of covering on natural nails including finger and toenails. If you have artificial nails, gel coating, etc. that needs to be removed by a nail salon please have this removed prior to surgery or surgery Flury need to be canceled/ delayed if the surgeon/ anesthesia feels like they are unable to be safely monitored.   Do not shave  48 hours prior to surgery.    Do not bring valuables to the hospital. Yetter.   Contacts, glasses, or bridgework Amico not be worn into surgery.   Bring small overnight bag day of surgery.   DO NOT Parachute. PHARMACY WILL DISPENSE MEDICATIONS LISTED ON YOUR MEDICATION LIST TO YOU DURING YOUR ADMISSION [place]!    Patients discharged on the day of surgery will not be allowed to drive home.  Someone NEEDS to stay with you for the first 24 hours after anesthesia.   Special Instructions: Bring a copy of your healthcare power of attorney and living will documents         the day of surgery if you haven't scanned them before.              Please read over the following fact sheets you were given: IF YOU HAVE QUESTIONS ABOUT YOUR PRE-OP INSTRUCTIONS PLEASE CALL [phone]    [facility] - Preparing for Surgery Before surgery, you can play an important role.   Because skin is not sterile, your skin needs to be as free of germs as possible.  You can reduce the number of germs on your skin by washing with CHG (chlorahexidine gluconate) soap before surgery.  CHG is an antiseptic cleaner which kills germs and bonds with the skin to continue killing germs even after washing. Please DO NOT use if you have an allergy to CHG or antibacterial soaps.  If your skin becomes reddened/irritated stop using the CHG and inform your nurse when you arrive at Short Stay. Do not shave (including legs and underarms) for at least 48 hours prior to the first CHG shower.  You Patti shave your face/neck. Please follow these instructions carefully:  1.  Shower with CHG Soap the night before surgery and the  morning of Surgery.  2.  If you choose to wash your hair, wash your hair first as usual with your  normal  shampoo.  3.  After you shampoo, rinse your hair and body thoroughly to remove the  shampoo.                           4.  Use CHG as you would any other liquid soap.  You can apply chg directly  to the skin and wash                       Gently with a scrungie or clean washcloth.  5.  Apply the CHG Soap to your body ONLY FROM THE NECK DOWN.   Do not use on face/ open  Wound or open sores. Avoid contact with eyes, ears mouth and genitals (private parts).                       Wash face,  Genitals (private parts) with your normal soap.             6.  Wash thoroughly, paying special attention to the area where your surgery  will be performed.  7.  Thoroughly rinse your body with warm water from the neck down.  8.  DO NOT shower/wash with your normal soap after using and rinsing off  the CHG Soap.                9.  Pat yourself dry with a clean towel.            10.  Wear clean pajamas.            11.  Place clean sheets on your bed the night of your first shower and do not  sleep with pets. Day of Surgery : Do not apply any lotions/deodorants the  morning of surgery.  Please wear clean clothes to the hospital/surgery center.  FAILURE TO FOLLOW THESE INSTRUCTIONS Brame RESULT IN THE CANCELLATION OF YOUR SURGERY PATIENT SIGNATURE_________________________________  NURSE SIGNATURE__________________________________  ________________________________________________________________________ WHAT IS A BLOOD TRANSFUSION? Blood Transfusion Information  A transfusion is the replacement of blood or some of its parts. Blood is made up of multiple cells which provide different functions. Red blood cells carry oxygen and are used for blood loss replacement. White blood cells fight against infection. Platelets control bleeding. Plasma helps clot blood. Other blood products are available for specialized needs, such as hemophilia or other clotting disorders. BEFORE THE TRANSFUSION  Who gives blood for transfusions?  Healthy volunteers who are fully evaluated to make sure their blood is safe. This is blood bank blood. Transfusion therapy is the safest it has ever been in the practice of medicine. Before blood is taken from a donor, a complete history is taken to make sure that person has no history of diseases nor engages in risky social behavior (examples are intravenous drug use or sexual activity with multiple partners). The donor's travel history is screened to minimize risk of transmitting infections, such as malaria. The donated blood is tested for signs of infectious diseases, such as HIV and hepatitis. The blood is then tested to be sure it is compatible with you in order to minimize the chance of a transfusion reaction. If you or a relative donates blood, this is often done in anticipation of surgery and is not appropriate for emergency situations. It takes many days to process the donated blood. RISKS AND COMPLICATIONS Although transfusion therapy is very safe and saves many lives, the main dangers of transfusion include:  Getting an infectious  disease. Developing a transfusion reaction. This is an allergic reaction to something in the blood you were given. Every precaution is taken to prevent this. The decision to have a blood transfusion has been considered carefully by your caregiver before blood is given. Blood is not given unless the benefits outweigh the risks. AFTER THE TRANSFUSION Right after receiving a blood transfusion, you will usually feel much better and more energetic. This is especially true if your red blood cells have gotten low (anemic). The transfusion raises the level of the red blood cells which carry oxygen, and this usually causes an energy increase. The nurse administering the transfusion will monitor you carefully for complications.  HOME CARE INSTRUCTIONS  No special instructions are needed after a transfusion. You Oehlert find your energy is better. Speak with your caregiver about any limitations on activity for underlying diseases you Duarte have. SEEK MEDICAL CARE IF:  Your condition is not improving after your transfusion. You develop redness or irritation at the intravenous (IV) site. SEEK IMMEDIATE MEDICAL CARE IF:  Any of the following symptoms occur over the next 12 hours: Shaking chills. You have a temperature by mouth above 102 F (38.9 C), not controlled by medicine. Chest, back, or muscle pain. People around you feel you are not acting correctly or are confused. Shortness of breath or difficulty breathing. Dizziness and fainting. You get a rash or develop hives. You have a decrease in urine output. Your urine turns a dark color or changes to pink, red, or brown. Any of the following symptoms occur over the next 10 days: You have a temperature by mouth above 102 F (38.9 C), not controlled by medicine. Shortness of breath. Weakness after normal activity. The white part of the eye turns yellow (jaundice). You have a decrease in the amount of urine or are urinating less often. Your urine turns a  dark color or changes to pink, red, or brown. Document Released: 08/28/2000 Document Revised: 11/23/2011 Document Reviewed: 04/16/2008 [facility] Patient Information 2014 [place], [place].  _______________________________________________________________________

## 2022-12-22 ENCOUNTER — Other Ambulatory Visit: Payer: Self-pay

## 2022-12-22 ENCOUNTER — Encounter (HOSPITAL_COMMUNITY): Payer: Self-pay

## 2022-12-22 ENCOUNTER — Inpatient Hospital Stay: Payer: 59 | Attending: Psychiatry | Admitting: Gynecologic Oncology

## 2022-12-22 ENCOUNTER — Encounter (HOSPITAL_COMMUNITY)
Admission: RE | Admit: 2022-12-22 | Discharge: 2022-12-22 | Disposition: A | Discharge status: Home / Self Care | Payer: 59 | Source: Ambulatory Visit | Attending: Psychiatry | Admitting: Psychiatry

## 2022-12-22 VITALS — BP 157/84 | HR 68 | Temp 97.5°F | Resp 16 | Ht 64.0 in | Wt 157.0 lb

## 2022-12-22 DIAGNOSIS — N9489 Other specified conditions associated with female genital organs and menstrual cycle: Secondary | ICD-10-CM

## 2022-12-22 DIAGNOSIS — Z01812 Encounter for preprocedural laboratory examination: Secondary | ICD-10-CM | POA: Diagnosis not present

## 2022-12-22 HISTORY — DX: Prediabetes: R73.03

## 2022-12-22 HISTORY — DX: Unspecified glaucoma: H40.9

## 2022-12-22 LAB — CBC
HCT: 42.7 % (ref 36.0–46.0)
Hemoglobin: 13.8 g/dL (ref 12.0–15.0)
MCH: 31.7 pg (ref 26.0–34.0)
MCHC: 32.3 g/dL (ref 30.0–36.0)
MCV: 98.2 fL (ref 80.0–100.0)
Platelets: 323 10*3/uL (ref 150–400)
RBC: 4.35 MIL/uL (ref 3.87–5.11)
RDW: 12.7 % (ref 11.5–15.5)
WBC: 4.9 10*3/uL (ref 4.0–10.5)
nRBC: 0 % (ref 0.0–0.2)

## 2022-12-22 LAB — COMPREHENSIVE METABOLIC PANEL
ALT: 16 U/L (ref 0–44)
AST: 17 U/L (ref 15–41)
Albumin: 4 g/dL (ref 3.5–5.0)
Alkaline Phosphatase: 78 U/L (ref 38–126)
Anion gap: 8 (ref 5–15)
BUN: 16 mg/dL (ref 8–23)
CO2: 24 mmol/L (ref 22–32)
Calcium: 9 mg/dL (ref 8.9–10.3)
Chloride: 105 mmol/L (ref 98–111)
Creatinine, Ser: 0.68 mg/dL (ref 0.44–1.00)
GFR, Estimated: >60 mL/min (ref >60)
Glucose, Bld: 98 mg/dL (ref 70–99)
Potassium: 4.7 mmol/L (ref 3.5–5.1)
Sodium: 137 mmol/L (ref 135–145)
Total Bilirubin: 0.6 mg/dL (ref 0.3–1.2)
Total Protein: 7.9 g/dL (ref 6.5–8.1)

## 2022-12-22 LAB — GLUCOSE, CAPILLARY: Glucose-Capillary: 89 mg/dL (ref 70–99)

## 2022-12-22 MED ORDER — POLYETHYLENE GLYCOL 3350 17 G PO PACK: 17.0000 g | PACK | Freq: Every day | ORAL | 0 refills | Status: DC

## 2022-12-22 MED ORDER — TRAMADOL HCL 50 MG PO TABS: 50.0000 mg | ORAL_TABLET | Freq: Four times a day (QID) | ORAL | 0 refills | Status: DC | PRN

## 2022-12-22 NOTE — Progress Notes (Signed)
For Short Stay: COVID SWAB appointment date:  Bowel Prep reminder:   For Anesthesia: PCP - Dr. Evelena Peat Cardiologist - N/A  Chest x-ray -  EKG -  Stress Test -  ECHO -  Cardiac Cath -  Pacemaker/ICD device last checked: Pacemaker orders received: Device Rep notified:  Spinal Cord Stimulator:  Sleep Study - N/A CPAP -   Fasting Blood Sugar - N/A Checks Blood Sugar ___0__ times a day Date and result of last Hgb A1c- 6.1: 10/21/22  Last dose of GLP1 agonist- N/A GLP1 instructions:   Last dose of SGLT-2 inhibitors-  SGLT-2 instructions:   Blood Thinner Instructions: Aspirin Instructions: Last Dose:  Activity level: Can go up a flight of stairs and activities of daily living without stopping and without chest pain and/or shortness of breath   Able to exercise without chest pain and/or shortness of breath  Anesthesia review: Hx: Pre-DIA.  Patient denies shortness of breath, fever, cough and chest pain at PAT appointment   Patient verbalized understanding of instructions that were given to them at the PAT appointment. Patient was also instructed that they will need to review over the PAT instructions again at home before surgery.

## 2022-12-22 NOTE — Patient Instructions (Signed)
Preparing for your Surgery  Plan for surgery on December 29, 2022 with Dr. Clide CliffMeredith Newton at [facility]. You will be scheduled for robotic assisted laparoscopic bilateral salpingo-oophorectomy (removal of both ovaries and fallopian tubes), possible staging, possible laparotomy (larger incision on your abdomen if needed).   Pre-operative Testing -(Done, today at 11 am) You will receive a phone call from presurgical testing at [facility] to arrange for a pre-operative appointment and lab work.  -Bring your insurance card, copy of an advanced directive if applicable, medication list  -At that visit, you will be asked to sign a consent for a possible blood transfusion in case a transfusion becomes necessary during surgery.  The need for a blood transfusion is rare but having consent is a necessary part of your care.     -You should not be taking blood thinners or aspirin at least ten days prior to surgery unless instructed by your surgeon.  -Do not take supplements such as fish oil (omega 3), red yeast rice, turmeric before your surgery. You want to avoid medications with aspirin in them including headache powders such as BC or Goody's), Excedrin migraine.  Day Before Surgery at Home -You will be asked to take in a light diet the day before surgery. You will be advised you can have clear liquids up until 3 hours before your surgery.    Eat a light diet the day before surgery.  Examples including soups, broths, toast, yogurt, mashed potatoes.  AVOID GAS PRODUCING FOODS AND BEVERAGES. Things to avoid include carbonated beverages (fizzy beverages, sodas), raw fruits and raw vegetables (uncooked), or beans.   If your bowels are filled with gas, your surgeon will have difficulty visualizing your pelvic organs which increases your surgical risks.  Your role in recovery Your role is to become active as soon as directed by your doctor, while still giving yourself time to heal.  Rest when  you feel tired. You will be asked to do the following in order to speed your recovery:  - Cough and breathe deeply. This helps to clear and expand your lungs and can prevent pneumonia after surgery.  - STAY ACTIVE WHEN YOU GET HOME. Do mild physical activity. Walking or moving your legs help your circulation and body functions return to normal. Do not try to get up or walk alone the first time after surgery.   -If you develop swelling on one leg or the other, pain in the back of your leg, redness/warmth in one of your legs, please call the office or go to the Emergency Room to have a doppler to rule out a blood clot. For shortness of breath, chest pain-seek care in the Emergency Room as soon as possible. - Actively manage your pain. Managing your pain lets you move in comfort. We will ask you to rate your pain on a scale of zero to 10. It is your responsibility to tell your doctor or nurse where and how much you hurt so your pain can be treated.  Special Considerations -If you are diabetic, you Frede be placed on insulin after surgery to have closer control over your blood sugars to promote healing and recovery.  This does not mean that you will be discharged on insulin.  If applicable, your oral antidiabetics will be resumed when you are tolerating a solid diet.  -Your final pathology results from surgery should be available around one week after surgery and the results will be relayed to you when available.  -  Dr. Antionette Char is the surgeon that assists your GYN Oncologist with surgery.  If you end up staying the night, the next day after your surgery you will either see Dr. Pricilla Holm, Dr. Alvester Morin, or Dr. Antionette Char.  -FMLA forms can be faxed to [phone] and please allow 5-7 business days for completion.  Pain Management After Surgery -You will be prescribed your pain medication and bowel regimen medications before surgery so that you can have these available when you are discharged  from the hospital. The pain medication is for use ONLY AFTER surgery and a new prescription will not be given.   -Make sure that you have Tylenol and Ibuprofen IF YOU ARE ABLE TO TAKE THESE MEDICATIONS at home to use on a regular basis after surgery for pain control. We recommend alternating the medications every hour to six hours since they work differently and are processed in the body differently for pain relief.  -Review the attached handout on narcotic use and their risks and side effects.   Bowel Regimen -You will be prescribed Sennakot-S to take nightly to prevent constipation especially if you are taking the narcotic pain medication intermittently.  It is important to prevent constipation and drink adequate amounts of liquids. You can stop taking this medication when you are not taking pain medication and you are back on your normal bowel routine.  Risks of Surgery Risks of surgery are low but include bleeding, infection, damage to surrounding structures, re-operation, blood clots, and very rarely death.   Blood Transfusion Information (For the consent to be signed before surgery)  We will be checking your blood type before surgery so in case of emergencies, we will know what type of blood you would need.                                            WHAT IS A BLOOD TRANSFUSION?  A transfusion is the replacement of blood or some of its parts. Blood is made up of multiple cells which provide different functions. Red blood cells carry oxygen and are used for blood loss replacement. White blood cells fight against infection. Platelets control bleeding. Plasma helps clot blood. Other blood products are available for specialized needs, such as hemophilia or other clotting disorders. BEFORE THE TRANSFUSION  Who gives blood for transfusions?  You Screws be able to donate blood to be used at a later date on yourself (autologous donation). Relatives can be asked to donate blood. This is  generally not any safer than if you have received blood from a stranger. The same precautions are taken to ensure safety when a relative's blood is donated. Healthy volunteers who are fully evaluated to make sure their blood is safe. This is blood bank blood. Transfusion therapy is the safest it has ever been in the practice of medicine. Before blood is taken from a donor, a complete history is taken to make sure that person has no history of diseases nor engages in risky social behavior (examples are intravenous drug use or sexual activity with multiple partners). The donor's travel history is screened to minimize risk of transmitting infections, such as malaria. The donated blood is tested for signs of infectious diseases, such as HIV and hepatitis. The blood is then tested to be sure it is compatible with you in order to minimize the chance of a transfusion reaction. If you or  a relative donates blood, this is often done in anticipation of surgery and is not appropriate for emergency situations. It takes many days to process the donated blood. RISKS AND COMPLICATIONS Although transfusion therapy is very safe and saves many lives, the main dangers of transfusion include:  Getting an infectious disease. Developing a transfusion reaction. This is an allergic reaction to something in the blood you were given. Every precaution is taken to prevent this. The decision to have a blood transfusion has been considered carefully by your caregiver before blood is given. Blood is not given unless the benefits outweigh the risks.  AFTER SURGERY INSTRUCTIONS  Return to work: 4-6 weeks if applicable  Activity: 1. Be up and out of the bed during the day.  Take a nap if needed.  You Waymire walk up steps but be careful and use the hand rail.  Stair climbing will tire you more than you think, you Taboada need to stop part way and rest.   2. No lifting or straining for 6 weeks over 10 pounds. No pushing, pulling, straining  for 6 weeks.  3. No driving for around 1 week(s).  Do not drive if you are taking narcotic pain medicine and make sure that your reaction time has returned.   4. You can shower as soon as the next day after surgery. Shower daily.  Use your regular soap and water (not directly on the incision) and pat your incision(s) dry afterwards; don't rub.  No tub baths or submerging your body in water until cleared by your surgeon. If you have the soap that was given to you by pre-surgical testing that was used before surgery, you do not need to use it afterwards because this can irritate your incisions.   5. No sexual activity and nothing in the vagina for 4-6 weeks.  6. You Mowrer experience a small amount of clear drainage from your incisions, which is normal.  If the drainage persists, increases, or changes color please call the office.  7. Do not use creams, lotions, or ointments such as neosporin on your incisions after surgery until advised by your surgeon because they can cause removal of the dermabond glue on your incisions.    8. You Baity experience vaginal spotting after surgery.  The spotting is normal but if you experience heavy bleeding, call our office.  9. Take Tylenol or ibuprofen first for pain if you are able to take these medications and only use narcotic pain medication for severe pain not relieved by the Tylenol or Ibuprofen.  Monitor your Tylenol intake to a max of 4,000 mg in a 24 hour period. You can alternate these medications after surgery.  Diet: 1. Low sodium Heart Healthy Diet is recommended but you are cleared to resume your normal (before surgery) diet after your procedure.  2. It is safe to use a laxative, such as Miralax or Colace, if you have difficulty moving your bowels. You have been prescribed Sennakot-S to take at bedtime every evening after surgery to keep bowel movements regular and to prevent constipation.    Wound Care: 1. Keep clean and dry.  Shower  daily.  Reasons to call the Doctor: Fever - Oral temperature greater than 100.4 degrees Fahrenheit Foul-smelling vaginal discharge Difficulty urinating Nausea and vomiting Increased pain at the site of the incision that is unrelieved with pain medicine. Difficulty breathing with or without chest pain New calf pain especially if only on one side Sudden, continuing increased vaginal bleeding with or  without clots.   Contacts: For questions or concerns you should contact:  Dr. Clide Cliff at [phone]  Warner Mccreedy, NP at [phone]  After Hours: call [phone] and have the GYN Oncologist paged/contacted (after 5 pm or on the weekends). You will speak with an after hours RN and let he or she know you have had surgery.  Messages sent via mychart are for non-urgent matters and are not responded to after hours so for urgent needs, please call the after hours number.

## 2022-12-24 DIAGNOSIS — M25511 Pain in right shoulder: Secondary | ICD-10-CM | POA: Diagnosis not present

## 2022-12-24 DIAGNOSIS — Z79899 Other long term (current) drug therapy: Secondary | ICD-10-CM | POA: Diagnosis not present

## 2022-12-24 DIAGNOSIS — L4059 Other psoriatic arthropathy: Secondary | ICD-10-CM | POA: Diagnosis not present

## 2022-12-24 DIAGNOSIS — Z111 Encounter for screening for respiratory tuberculosis: Secondary | ICD-10-CM | POA: Diagnosis not present

## 2022-12-24 DIAGNOSIS — E559 Vitamin D deficiency, unspecified: Secondary | ICD-10-CM | POA: Diagnosis not present

## 2022-12-24 DIAGNOSIS — E663 Overweight: Secondary | ICD-10-CM | POA: Diagnosis not present

## 2022-12-24 DIAGNOSIS — R195 Other fecal abnormalities: Secondary | ICD-10-CM | POA: Diagnosis not present

## 2022-12-24 DIAGNOSIS — Z6827 Body mass index (BMI) 27.0-27.9, adult: Secondary | ICD-10-CM | POA: Diagnosis not present

## 2022-12-24 DIAGNOSIS — L409 Psoriasis, unspecified: Secondary | ICD-10-CM | POA: Diagnosis not present

## 2022-12-24 NOTE — Progress Notes (Signed)
[facility] Consult Note:  History: Abigail Hayes 12/25/2022  Referring provider: Kristian Covey, MD  Reason for consult/chief complaint: Abdominal Pain (Pt is having a lot pain  in her RUQ, pt been having problems for a year and a half)   Subjective  HPI: 62 year old woman patient of Dr. Christella Hartigan last seen by APP August 2022 for chronic abdominal bloating diarrhea and pain.  Extensive clinical details outlined in that note.  Routine surveillance colonoscopy with Dr. Christella Hartigan November 2021, diminutive adenomatous polyp. 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 ___________________  This is a very pleasant 62 year old woman who request evaluation here for right-sided abdominal pain.  She says it has been going on for about 2 years without change during that period.  It is in the right upper quadrant of the abdominal wall, and she can point to a focal area about the size of a quarter where it is always present.  It is not meal related with no associated nausea vomiting altered bowel habits or rectal bleeding when it occurs.  She has long had tendencies to diarrhea bloating and believes she has IBS. She initially thought the pain Zuccaro be related to certain exercise or other physical activity, but she has not noticed it consistently occurring with those activities nor when she scaled back swimming and other core activities.  Brought it up with her gynecologist when the MRI pelvis was done June 2022 working up and following her ovarian lesion, but remainder of abdominal cavity not scanned at that time.  Bilateral BSO scheduled for next week due to ovarian lesion. ROS:  Review of Systems  Constitutional:  Negative for appetite change and unexpected weight change.  HENT:  Negative for mouth sores and voice change.   Eyes:  Negative for pain and redness.  Respiratory:  Negative for cough and  shortness of breath.   Cardiovascular:  Negative for chest pain and palpitations.  Genitourinary:  Negative for dysuria and hematuria.  Musculoskeletal:  Positive for arthralgias. Negative for myalgias.  Skin:  Negative for pallor and rash.       Psoriasis  Neurological:  Negative for weakness and headaches.  Hematological:  Negative for adenopathy.   She has also had recent right ear canal pain that she mentioned primary care visit when she was also seen for dizziness.   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   Enlargement of lymph nodes 07/17/2009   Glaucoma    Osteoporosis 06/19/2020   Ovarian cyst 03/2021   Pre-diabetes    PSORIATIC ARTHRITIS 07/17/2009     Past Surgical History: Past Surgical History:  Procedure Laterality Date   ANTERIOR CRUCIATE LIGAMENT REPAIR Left 2004   COLONOSCOPY  2011   2011; 07-2020   Ovarian cyst removed  1990     Family History: Family History  Problem Relation Age of Onset   Breast cancer Mother    Endometrial cancer Mother    Cancer Father        ?tonsillar cancer   Esophageal cancer Father    Ovarian cancer Paternal Aunt    Colon cancer Other    Hyperlipidemia Other    Hypertension Other    Heart disease Other    Prostate cancer Other    Pancreatic cancer  Other    Colon polyps Neg Hx    Stomach cancer Neg Hx    Rectal cancer Neg Hx     Social History: Social History   Socioeconomic History   Marital status: Married    Spouse name: Not on file   Number of children: 3   Years of education: Not on file   Highest education level: Master's degree (e.g., MA, MS, MEng, MEd, MSW, MBA)  Occupational History   Occupation: Designer, television/film set: [employer]  Tobacco Use   Smoking status: Never   Smokeless tobacco: Never  Building services engineer Use: Never used  Substance and Sexual Activity   Alcohol use: Yes     Comment: 1-2 glases of wine with dinner daily   Drug use: No   Sexual activity: Yes    Birth control/protection: Post-menopausal    Comment: Vasectomy  Other Topics Concern   Not on file  Social History Narrative   Right handed   Two story home   Drinks caffeine   Social Determinants of Health   Financial Resource Strain: Low Risk  (11/05/2022)   Overall Financial Resource Strain (CARDIA)    Difficulty of Paying Living Expenses: Not very hard  Food Insecurity: No Food Insecurity (11/05/2022)   Hunger Vital Sign    Worried About Running Out of Food in the Last Year: Never true    Ran Out of Food in the Last Year: Never true  Transportation Needs: No Transportation Needs (11/05/2022)   PRAPARE - Transportation    Lack of Transportation (Medical): No    Lack of Transportation (Non-Medical): No  Physical Activity: Sufficiently Active (11/05/2022)   Exercise Vital Sign    Days of Exercise per Week: 5 days    Minutes of Exercise per Session: 40 min  Stress: No Stress Concern Present (11/05/2022)   [employer] of Occupational Health - Occupational Stress Questionnaire    Feeling of Stress : Only a little  Social Connections: Moderately Integrated (11/05/2022)   Social Connection and Isolation Panel [NHANES]    Frequency of Communication with Friends and Family: More than three times a week    Frequency of Social Gatherings with Friends and Family: More than three times a week    Attends Religious Services: Never    Database administrator or Organizations: Yes    Attends Engineer, structural: More than 4 times per year    Marital Status: Married    Allergies: Allergies  Allergen Reactions   Alpha-Gal Anaphylaxis, Hives and Cough    Galactose-Alpha-1,3-Galactose  Gelatin    Outpatient Meds: Current Outpatient Medications  Medication Sig Dispense Refill   alendronate (FOSAMAX) 70 MG tablet Take 70 mg by mouth once a week.     EPINEPHrine (EPIPEN 2-PAK) 0.3 mg/0.3  mL IJ SOAJ injection EpiPen 2-Pak 0.3 mg/0.3 mL injection, auto-injector 1 each 1   ibuprofen (ADVIL,MOTRIN) 200 MG tablet Take 200-400 mg by mouth every 6 (six) hours as needed for pain.     latanoprost (XALATAN) 0.005 % ophthalmic solution Place 1 drop into both eyes at bedtime.     polyethylene glycol (MIRALAX) 17 g packet Take 17 g by mouth daily. For AFTER surgery, do not take if having diarrhea (Patient not taking: Reported on 12/25/2022) 14 each 0   traMADol (ULTRAM) 50 MG tablet Take 1 tablet (50 mg total) by mouth every 6 (six) hours as needed for severe pain. For AFTER surgery, do not take and drive (Patient  not taking: Reported on 12/25/2022) 10 tablet 0   No current facility-administered medications for this visit.      ___________________________________________________________________ Objective   Exam:  BP 120/78   Pulse 78   Ht 5\' 4"  (1.626 m)   Wt 158 lb (71.7 kg)   LMP 06/03/2011   BMI 27.12 kg/m  Wt Readings from Last 3 Encounters:  12/25/22 158 lb (71.7 kg)  12/22/22 156 lb 8.4 oz (71 kg)  12/22/22 157 lb (71.2 kg)    General: Well-appearing Eyes: sclera anicteric, no redness ENT: oral mucosa moist without lesions, no cervical or supraclavicular lymphadenopathy.  Edematous and erythematous right ear canal (no otoscope available) no crusting present, but she says there was recently. CV: Regular without appreciable murmur, no JVD, no peripheral edema Resp: clear to auscultation bilaterally, normal RR and effort noted GI: soft, focal right upper abdominal wall tenderness,, spot located when she contracts her abdominal musculature.  No palpable abnormality of the abdominal wall, overlying skin normal, no palpable internal mass.  She has normal active bowel sounds. No guarding or palpable organomegaly noted. Skin; warm and dry, no rash or jaundice noted Neuro: awake, alert and oriented x 3. Normal gross motor function and fluent speech Negative Carnett's  sign Labs:     Latest Ref Rng & Units 12/22/2022   11:40 AM 04/23/2021    9:26 AM 08/31/2018   12:00 AM  CBC  WBC 4.0 - 10.5 K/uL 4.9  4.1  4.1      Hemoglobin 12.0 - 15.0 g/dL 81.113.8  91.414.1  78.214.8      Hematocrit 36.0 - 46.0 % 42.7  41.8  45      Platelets 150 - 400 K/uL 323  302.0  372         This result is from an external source.      Latest Ref Rng & Units 12/22/2022   11:40 AM 04/23/2021    9:26 AM 08/31/2018   12:00 AM  CMP  Glucose 70 - 99 mg/dL 98  91    BUN 8 - 23 mg/dL 16  11  14       Creatinine 0.44 - 1.00 mg/dL 9.560.68  2.130.67  0.8      Sodium 135 - 145 mmol/L 137  139  142      Potassium 3.5 - 5.1 mmol/L 4.7  3.9  5.1      Chloride 98 - 111 mmol/L 105  104    CO2 22 - 32 mmol/L 24  22    Calcium 8.9 - 10.3 mg/dL 9.0  9.6    Total Protein 6.5 - 8.1 g/dL 7.9  7.8    Total Bilirubin 0.3 - 1.2 mg/dL 0.6  0.4    Alkaline Phos 38 - 126 U/L 78  72  94      AST 15 - 41 U/L 17  15  17       ALT 0 - 44 U/L 16  16  17          This result is from an external source.   Fecal calprotectin 20   Radiologic Studies:  MRI pelvis report from 2022 on file  Assessment: Encounter Diagnoses  Name Primary?   RUQ pain Yes   Chronic diarrhea     Right upper quadrant pain that is not clearly digestive in nature.  It does not sound typical for biliary colic, does not sound obstructive.  In fact, it sounds most likely musculoskeletal.  She says it  just bothers her from time to time and she can live with it but it is a nuisance and she wanted to be sure there was nothing harmful.  Plan:  I recommended she first try Voltaren ointment or lidocaine patch, since she said that ibuprofen seems to make the pain better. I also recommended CT scan abdomen (no pelvis needed since previously imaged) to be sure there is no other intra-abdominal source.  She also discussed this with her gynecologist and thinks they Earlywine be able to get some view of the abdominal cavity during her upcoming pelvic  surgery.  I recommended she wait 2 to 3 months after next week surgery to do the abdominal imaging since otherwise there Presas be postoperative inflammatory changes.   In addition is not clear if she has been screened for celiac disease with the reported IBS-like symptoms, so TTG antibody done today.  Lastly, it looks to me like she has otitis externa.  I recommended she contact primary care about this, (she recently saw Dr. [place] and Dr. Lucie Leather absence), so I will also forward today's note to bring it to their attention.  Brittania plans to contact that office later today.  40 minutes were spent on this encounter (including chart review, history/exam, counseling/coordination of care, and documentation) > 50% of that time was spent on counseling and coordination of care. Extensive chart review required on this patient who is essentially new to me.  Abigail Hayes  CC: Referring provider noted above

## 2022-12-24 NOTE — Progress Notes (Signed)
Patient here for a pre-operative appointment prior to her scheduled surgery on December 29, 2022. She is scheduled for robotic assisted laparoscopic bilateral salpingo-oophorectomy, possible staging, possible laparotomy. She has her pre-admission testing appointment later this am at [facility].  The surgery was discussed in detail.  See after visit summary for additional details.   Discussed post-op pain management in detail including the aspects of the enhanced recovery pathway.  Advised her that a new prescription would be sent in for tramadol and it is only to be used for after her upcoming surgery.  We discussed the use of tylenol post-op and to monitor for a maximum of 4,000 mg in a 24 hour period.  Also prescribed miralax to be used after surgery and to hold if having loose stools.  Discussed bowel regimen in detail.     Discussed the use of SCDs and measures to take at home to prevent DVT including frequent mobility.  Reportable signs and symptoms of DVT discussed. Post-operative instructions discussed and expectations for after surgery. Incisional care discussed as well including reportable signs and symptoms including erythema, drainage, wound separation.     10 minutes spent preparing information and with the patient.  Verbalizing understanding of material discussed. No needs or concerns voiced at the end of the visit.   Advised patient to call for any needs.  Advised that her post-operative medications had been prescribed and could be picked up at any time.    This appointment is included in the global surgical bundle as pre-operative teaching and has no charge.

## 2022-12-25 ENCOUNTER — Ambulatory Visit (INDEPENDENT_AMBULATORY_CARE_PROVIDER_SITE_OTHER): Payer: 59 | Admitting: Gastroenterology

## 2022-12-25 ENCOUNTER — Other Ambulatory Visit: Payer: 59

## 2022-12-25 ENCOUNTER — Telehealth: Payer: Self-pay

## 2022-12-25 ENCOUNTER — Encounter: Payer: Self-pay | Admitting: Gastroenterology

## 2022-12-25 VITALS — BP 120/78 | HR 78 | Ht 64.0 in | Wt 158.0 lb

## 2022-12-25 DIAGNOSIS — K529 Noninfective gastroenteritis and colitis, unspecified: Secondary | ICD-10-CM

## 2022-12-25 DIAGNOSIS — R1011 Right upper quadrant pain: Secondary | ICD-10-CM

## 2022-12-25 NOTE — Patient Instructions (Signed)
_______________________________________________________  If your blood pressure at your visit was 140/90 or greater, please contact your primary care physician to follow up on this.  _______________________________________________________  If you are age 63 or older, your body mass index should be between 23-30. Your Body mass index is 27.12 kg/m. If this is out of the aforementioned range listed, please consider follow up with your Primary Care Provider.  If you are age 14 or younger, your body mass index should be between 19-25. Your Body mass index is 27.12 kg/m. If this is out of the aformentioned range listed, please consider follow up with your Primary Care Provider.   ________________________________________________________  The [facility] providers would like to encourage you to use [facility] to communicate with providers for non-urgent requests or questions.  Due to long hold times on the telephone, sending your provider a message by [facility] be a faster and more efficient way to get a response.  Please allow 48 business hours for a response.  Please remember that this is for non-urgent requests.  _______________________________________________________  Your provider has requested that you go to the basement level for lab work before leaving today. Press "B" on the elevator. The lab is located at the first door on the left as you exit the elevator.  Due to recent changes in healthcare laws, you Damron see the results of your imaging and laboratory studies on MyChart before your provider has had a chance to review them.  We understand that in some cases there Speak be results that are confusing or concerning to you. Not all laboratory results come back in the same time frame and the provider Zaccone be waiting for multiple results in order to interpret others.  Please give [place] 48 hours in order for your provider to thoroughly review all the results before contacting the office for clarification of  your results.   It was a pleasure to see you today!  Thank you for trusting me with your gastrointestinal care!

## 2022-12-25 NOTE — Telephone Encounter (Signed)
Told Abigail Hayes that Her [employer] is accepted by the anesthesia group that will be working with her during her surgery 4-16.24.  Pt inquired if she will be receiving an antibiotic  during her surgery.  Told her that Warner Mccreedy, NP stated that if she just has her tubes and ovaries removed and antibiotic in not indicated. If she has a hysterectomy an antibiotic is indicated and is usually ancef. Pt not sure if she has an ear infection. PCP feels it is her Psoriasis. She wanted to know about an antibiotic to update her pcp if an antibiotic is needed for her ear prior to surgery 12-29-22.

## 2022-12-27 LAB — IGA: Immunoglobulin A: 817 mg/dL — ABNORMAL HIGH (ref 70–320)

## 2022-12-27 LAB — TISSUE TRANSGLUTAMINASE, IGA: (tTG) Ab, IgA: <1 U/mL

## 2022-12-28 ENCOUNTER — Ambulatory Visit: Payer: 59 | Admitting: Family Medicine

## 2022-12-28 ENCOUNTER — Telehealth: Payer: Self-pay | Admitting: Surgery

## 2022-12-28 NOTE — Telephone Encounter (Signed)
Telephone call to check on pre-operative status.  Patient compliant with pre-operative instructions.  Reinforced nothing to eat after midnight. Clear liquids until 4:15am. Patient to arrive at 5:15am. Verified that post-op medications have been sent to patient's preferred pharmacy.  No questions or concerns voiced.  Instructed to call for any needs. 

## 2022-12-28 NOTE — Anesthesia Preprocedure Evaluation (Signed)
Anesthesia Evaluation  Patient identified by MRN, date of birth, ID band Patient awake    Reviewed: Allergy & Precautions, NPO status , Patient's Chart, lab work & pertinent test results  Airway Mallampati: II  TM Distance: >3 FB Neck ROM: Full    Dental no notable dental hx. (+) Teeth Intact, Dental Advisory Given   Pulmonary neg pulmonary ROS   Pulmonary exam normal breath sounds clear to auscultation       Cardiovascular negative cardio ROS Normal cardiovascular exam Rhythm:Regular Rate:Normal     Neuro/Psych negative neurological ROS  negative psych ROS   GI/Hepatic negative GI ROS, Neg liver ROS,,,  Endo/Other  negative endocrine ROS    Renal/GU negative Renal ROS  negative genitourinary   Musculoskeletal  (+) Arthritis ,    Abdominal   Peds  Hematology negative hematology ROS (+)   Anesthesia Other Findings Adnexal mass  Reproductive/Obstetrics                             Anesthesia Physical Anesthesia Plan  ASA: 2  Anesthesia Plan: General   Post-op Pain Management: Tylenol PO (pre-op)*   Induction: Intravenous  PONV Risk Score and Plan: 3 and Midazolam, Dexamethasone and Ondansetron  Airway Management Planned: Oral ETT  Additional Equipment:   Intra-op Plan:   Post-operative Plan: Extubation in OR  Informed Consent: I have reviewed the patients History and Physical, chart, labs and discussed the procedure including the risks, benefits and alternatives for the proposed anesthesia with the patient or authorized representative who has indicated his/her understanding and acceptance.     Dental advisory given  Plan Discussed with: CRNA  Anesthesia Plan Comments: (2 IVs)       Anesthesia Quick Evaluation

## 2022-12-29 ENCOUNTER — Telehealth: Payer: Self-pay

## 2022-12-29 ENCOUNTER — Ambulatory Visit (HOSPITAL_COMMUNITY)
Admission: RE | Admit: 2022-12-29 | Discharge: 2022-12-29 | Disposition: A | Discharge status: Home / Self Care | Payer: 59 | Source: Ambulatory Visit | Attending: Psychiatry | Admitting: Psychiatry

## 2022-12-29 ENCOUNTER — Ambulatory Visit (HOSPITAL_COMMUNITY): Payer: 59 | Admitting: Certified Registered"

## 2022-12-29 ENCOUNTER — Other Ambulatory Visit: Payer: Self-pay

## 2022-12-29 ENCOUNTER — Encounter (HOSPITAL_COMMUNITY)
Admission: RE | Disposition: A | Discharge status: Home / Self Care | Payer: Self-pay | Source: Ambulatory Visit | Attending: Psychiatry

## 2022-12-29 ENCOUNTER — Encounter (HOSPITAL_COMMUNITY): Payer: Self-pay | Admitting: Psychiatry

## 2022-12-29 ENCOUNTER — Ambulatory Visit (HOSPITAL_BASED_OUTPATIENT_CLINIC_OR_DEPARTMENT_OTHER): Payer: 59 | Admitting: Certified Registered"

## 2022-12-29 DIAGNOSIS — N9489 Other specified conditions associated with female genital organs and menstrual cycle: Secondary | ICD-10-CM | POA: Diagnosis not present

## 2022-12-29 DIAGNOSIS — D270 Benign neoplasm of right ovary: Secondary | ICD-10-CM

## 2022-12-29 DIAGNOSIS — R1031 Right lower quadrant pain: Secondary | ICD-10-CM | POA: Diagnosis not present

## 2022-12-29 DIAGNOSIS — N838 Other noninflammatory disorders of ovary, fallopian tube and broad ligament: Secondary | ICD-10-CM | POA: Diagnosis not present

## 2022-12-29 HISTORY — PX: ROBOTIC ASSISTED SALPINGO OOPHERECTOMY: SHX6082

## 2022-12-29 LAB — TYPE AND SCREEN
ABO/RH(D): O POS
Antibody Screen: NEGATIVE

## 2022-12-29 LAB — ABO/RH: ABO/RH(D): O POS

## 2022-12-29 SURGERY — SALPINGO-OOPHORECTOMY, ROBOT-ASSISTED
Anesthesia: General | Site: Abdomen | Laterality: Bilateral

## 2022-12-29 MED ORDER — ONDANSETRON HCL 4 MG/2ML IJ SOLN
INTRAMUSCULAR | Status: DC | PRN
  Administered 2022-12-29: 4 mg via INTRAVENOUS

## 2022-12-29 MED ORDER — FENTANYL CITRATE (PF) 100 MCG/2ML IJ SOLN
INTRAMUSCULAR | Status: AC
  Filled 2022-12-29: qty 2

## 2022-12-29 MED ORDER — DEXAMETHASONE SODIUM PHOSPHATE 10 MG/ML IJ SOLN
INTRAMUSCULAR | Status: AC
  Filled 2022-12-29: qty 1

## 2022-12-29 MED ORDER — PROPOFOL 10 MG/ML IV BOLUS
INTRAVENOUS | Status: AC
  Filled 2022-12-29: qty 20

## 2022-12-29 MED ORDER — MIDAZOLAM HCL 2 MG/2ML IJ SOLN
INTRAMUSCULAR | Status: AC
  Filled 2022-12-29: qty 2

## 2022-12-29 MED ORDER — DEXAMETHASONE SODIUM PHOSPHATE 10 MG/ML IJ SOLN
INTRAMUSCULAR | Status: DC | PRN
  Administered 2022-12-29: 4 mg via INTRAVENOUS

## 2022-12-29 MED ORDER — FENTANYL CITRATE PF 50 MCG/ML IJ SOSY
25.0000 ug | PREFILLED_SYRINGE | INTRAMUSCULAR | Status: DC | PRN
  Administered 2022-12-29: 50 ug via INTRAVENOUS

## 2022-12-29 MED ORDER — ROCURONIUM BROMIDE 10 MG/ML (PF) SYRINGE
PREFILLED_SYRINGE | INTRAVENOUS | Status: DC | PRN
  Administered 2022-12-29: 50 mg via INTRAVENOUS

## 2022-12-29 MED ORDER — LACTATED RINGERS IR SOLN
Status: DC | PRN
  Administered 2022-12-29: 1000 mL

## 2022-12-29 MED ORDER — STERILE WATER FOR IRRIGATION IR SOLN
Status: DC | PRN
  Administered 2022-12-29: 1000 mL

## 2022-12-29 MED ORDER — PROPOFOL 10 MG/ML IV BOLUS
INTRAVENOUS | Status: DC | PRN
  Administered 2022-12-29: 50 mg via INTRAVENOUS
  Administered 2022-12-29: 150 mg via INTRAVENOUS

## 2022-12-29 MED ORDER — AMISULPRIDE (ANTIEMETIC) 5 MG/2ML IV SOLN
10.0000 mg | Freq: Once | INTRAVENOUS | Status: AC
  Administered 2022-12-29: 10 mg via INTRAVENOUS

## 2022-12-29 MED ORDER — AMISULPRIDE (ANTIEMETIC) 5 MG/2ML IV SOLN
INTRAVENOUS | Status: AC
  Filled 2022-12-29: qty 4

## 2022-12-29 MED ORDER — CHLORHEXIDINE GLUCONATE 0.12 % MT SOLN
15.0000 mL | Freq: Once | OROMUCOSAL | Status: AC
  Administered 2022-12-29: 15 mL via OROMUCOSAL

## 2022-12-29 MED ORDER — LACTATED RINGERS IV SOLN: INTRAVENOUS | Status: DC | PRN

## 2022-12-29 MED ORDER — FENTANYL CITRATE (PF) 100 MCG/2ML IJ SOLN
INTRAMUSCULAR | Status: DC | PRN
  Administered 2022-12-29: 100 ug via INTRAVENOUS
  Administered 2022-12-29 (×2): 50 ug via INTRAVENOUS

## 2022-12-29 MED ORDER — BUPIVACAINE HCL 0.25 % IJ SOLN
INTRAMUSCULAR | Status: DC | PRN
  Administered 2022-12-29: 20 mL

## 2022-12-29 MED ORDER — LACTATED RINGERS IV SOLN: INTRAVENOUS | Status: DC

## 2022-12-29 MED ORDER — MIDAZOLAM HCL 2 MG/2ML IJ SOLN
INTRAMUSCULAR | Status: DC | PRN
  Administered 2022-12-29: 2 mg via INTRAVENOUS

## 2022-12-29 MED ORDER — BUPIVACAINE HCL 0.25 % IJ SOLN
INTRAMUSCULAR | Status: AC
  Filled 2022-12-29: qty 1

## 2022-12-29 MED ORDER — ONDANSETRON HCL 4 MG/2ML IJ SOLN: INTRAMUSCULAR | Status: DC | PRN

## 2022-12-29 MED ORDER — SCOPOLAMINE 1 MG/3DAYS TD PT72
1.0000 | MEDICATED_PATCH | TRANSDERMAL | Status: DC
  Administered 2022-12-29: 1.5 mg via TRANSDERMAL
  Filled 2022-12-29: qty 1

## 2022-12-29 MED ORDER — KETAMINE HCL 50 MG/5ML IJ SOSY
PREFILLED_SYRINGE | INTRAMUSCULAR | Status: AC
  Filled 2022-12-29: qty 5

## 2022-12-29 MED ORDER — ROCURONIUM BROMIDE 10 MG/ML (PF) SYRINGE
PREFILLED_SYRINGE | INTRAVENOUS | Status: AC
  Filled 2022-12-29: qty 10

## 2022-12-29 MED ORDER — LIDOCAINE 2% (20 MG/ML) 5 ML SYRINGE
INTRAMUSCULAR | Status: DC | PRN
  Administered 2022-12-29: 60 mg via INTRAVENOUS

## 2022-12-29 MED ORDER — EPHEDRINE SULFATE-NACL 50-0.9 MG/10ML-% IV SOSY
PREFILLED_SYRINGE | INTRAVENOUS | Status: DC | PRN
  Administered 2022-12-29: 5 mg via INTRAVENOUS

## 2022-12-29 MED ORDER — KETAMINE HCL 10 MG/ML IJ SOLN
INTRAMUSCULAR | Status: DC | PRN
  Administered 2022-12-29: 10 mg via INTRAVENOUS
  Administered 2022-12-29: 20 mg via INTRAVENOUS

## 2022-12-29 MED ORDER — ONDANSETRON HCL 4 MG/2ML IJ SOLN
INTRAMUSCULAR | Status: AC
  Filled 2022-12-29: qty 2

## 2022-12-29 MED ORDER — SUGAMMADEX SODIUM 200 MG/2ML IV SOLN
INTRAVENOUS | Status: DC | PRN
  Administered 2022-12-29: 200 mg via INTRAVENOUS

## 2022-12-29 MED ORDER — ORAL CARE MOUTH RINSE: 15.0000 mL | Freq: Once | OROMUCOSAL | Status: AC

## 2022-12-29 MED ORDER — FENTANYL CITRATE PF 50 MCG/ML IJ SOSY
PREFILLED_SYRINGE | INTRAMUSCULAR | Status: AC
  Administered 2022-12-29: 50 ug via INTRAVENOUS
  Filled 2022-12-29: qty 2

## 2022-12-29 MED ORDER — ACETAMINOPHEN 500 MG PO TABS
1000.0000 mg | ORAL_TABLET | Freq: Once | ORAL | Status: AC
  Administered 2022-12-29: 1000 mg via ORAL
  Filled 2022-12-29: qty 2

## 2022-12-29 SURGICAL SUPPLY — 79 items
ADH SKN CLS APL DERMABOND .7 (GAUZE/BANDAGES/DRESSINGS) ×1
AGENT HMST KT MTR STRL THRMB (HEMOSTASIS)
APL ESCP 34 STRL LF DISP (HEMOSTASIS)
APPLICATOR SURGIFLO ENDO (HEMOSTASIS) IMPLANT
BAG LAPAROSCOPIC 12 15 PORT 16 (BASKET) IMPLANT
BAG RETRIEVAL 12/15 (BASKET)
BLADE SURG SZ10 CARB STEEL (BLADE) IMPLANT
COVER BACK TABLE 60X90IN (DRAPES) ×2 IMPLANT
COVER TIP SHEARS 8 DVNC (MISCELLANEOUS) ×2 IMPLANT
DERMABOND ADVANCED .7 DNX12 (GAUZE/BANDAGES/DRESSINGS) ×2 IMPLANT
DRAPE ARM DVNC X/XI (DISPOSABLE) ×8 IMPLANT
DRAPE COLUMN DVNC XI (DISPOSABLE) ×2 IMPLANT
DRAPE SHEET LG 3/4 BI-LAMINATE (DRAPES) ×2 IMPLANT
DRAPE SURG IRRIG POUCH 19X23 (DRAPES) ×2 IMPLANT
DRIVER NDL MEGA 8 DVNC XI (INSTRUMENTS) ×4 IMPLANT
DRIVER NDLE MEGA DVNC XI (INSTRUMENTS) ×2 IMPLANT
DRSG OPSITE POSTOP 4X6 (GAUZE/BANDAGES/DRESSINGS) IMPLANT
DRSG OPSITE POSTOP 4X8 (GAUZE/BANDAGES/DRESSINGS) IMPLANT
ELECT PENCIL ROCKER SW 15FT (MISCELLANEOUS) IMPLANT
ELECT REM PT RETURN 15FT ADLT (MISCELLANEOUS) ×2 IMPLANT
FORCEPS BPLR FENES DVNC XI (FORCEP) ×2 IMPLANT
FORCEPS PROGRASP DVNC XI (FORCEP) ×2 IMPLANT
GAUZE 4X4 16PLY ~~LOC~~+RFID DBL (SPONGE) ×2 IMPLANT
GLOVE BIO SURGEON STRL SZ 6 (GLOVE) ×8 IMPLANT
GLOVE BIO SURGEON STRL SZ 6.5 (GLOVE) ×2 IMPLANT
GLOVE BIOGEL PI IND STRL 6.5 (GLOVE) ×4 IMPLANT
GOWN STRL REUS W/ TWL LRG LVL3 (GOWN DISPOSABLE) ×8 IMPLANT
GOWN STRL REUS W/TWL LRG LVL3 (GOWN DISPOSABLE) ×4
GRASPER SUT TROCAR 14GX15 (MISCELLANEOUS) IMPLANT
HOLDER FOLEY CATH W/STRAP (MISCELLANEOUS) IMPLANT
IRRIG SUCT STRYKERFLOW 2 WTIP (MISCELLANEOUS) ×1
IRRIGATION SUCT STRKRFLW 2 WTP (MISCELLANEOUS) ×2 IMPLANT
KIT PROCEDURE DVNC SI (MISCELLANEOUS) IMPLANT
KIT TURNOVER KIT A (KITS) IMPLANT
LIGASURE IMPACT 36 18CM CVD LR (INSTRUMENTS) IMPLANT
MANIPULATOR ADVINCU DEL 3.0 PL (MISCELLANEOUS) IMPLANT
MANIPULATOR ADVINCU DEL 3.5 PL (MISCELLANEOUS) IMPLANT
MANIPULATOR UTERINE 4.5 ZUMI (MISCELLANEOUS) IMPLANT
NDL HYPO 21X1.5 SAFETY (NEEDLE) ×2 IMPLANT
NDL INSUFFLATION 14GA 120MM (NEEDLE) IMPLANT
NDL SPNL 18GX3.5 QUINCKE PK (NEEDLE) IMPLANT
NEEDLE HYPO 21X1.5 SAFETY (NEEDLE) ×1 IMPLANT
NEEDLE INSUFFLATION 14GA 120MM (NEEDLE) IMPLANT
NEEDLE SPNL 18GX3.5 QUINCKE PK (NEEDLE) IMPLANT
OBTURATOR OPTICAL STND 8 DVNC (TROCAR) ×1
OBTURATOR OPTICALSTD 8 DVNC (TROCAR) ×2 IMPLANT
PACK ROBOT GYN CUSTOM WL (TRAY / TRAY PROCEDURE) ×2 IMPLANT
PAD ARMBOARD 7.5X6 YLW CONV (MISCELLANEOUS) ×2 IMPLANT
PAD POSITIONING PINK XL (MISCELLANEOUS) ×2 IMPLANT
PORT ACCESS TROCAR AIRSEAL 12 (TROCAR) IMPLANT
SCISSORS MNPLR CVD DVNC XI (INSTRUMENTS) ×2 IMPLANT
SCRUB CHG 4% DYNA-HEX 4OZ (MISCELLANEOUS) ×4 IMPLANT
SEAL UNIV 5-12 XI (MISCELLANEOUS) ×6 IMPLANT
SET TRI-LUMEN FLTR TB AIRSEAL (TUBING) ×2 IMPLANT
SPIKE FLUID TRANSFER (MISCELLANEOUS) ×2 IMPLANT
SPONGE T-LAP 18X18 ~~LOC~~+RFID (SPONGE) IMPLANT
SURGIFLO W/THROMBIN 8M KIT (HEMOSTASIS) IMPLANT
SUT MNCRL AB 4-0 PS2 18 (SUTURE) IMPLANT
SUT PDS AB 1 TP1 54 (SUTURE) IMPLANT
SUT VIC AB 0 CT1 27 (SUTURE)
SUT VIC AB 0 CT1 27XBRD ANTBC (SUTURE) IMPLANT
SUT VIC AB 2-0 CT1 27 (SUTURE)
SUT VIC AB 2-0 CT1 TAPERPNT 27 (SUTURE) IMPLANT
SUT VICRYL 0 27 CT2 27 ABS (SUTURE) IMPLANT
SUT VICRYL 4-0 PS2 18IN ABS (SUTURE) ×4 IMPLANT
SUT VLOC 180 0 9IN  GS21 (SUTURE)
SUT VLOC 180 0 9IN GS21 (SUTURE) IMPLANT
SYR 10ML LL (SYRINGE) IMPLANT
SYS BAG RETRIEVAL 10MM (BASKET) ×1
SYS WOUND ALEXIS 18CM MED (MISCELLANEOUS)
SYSTEM BAG RETRIEVAL 10MM (BASKET) IMPLANT
SYSTEM WOUND ALEXIS 18CM MED (MISCELLANEOUS) IMPLANT
TOWEL OR NON WOVEN STRL DISP B (DISPOSABLE) IMPLANT
TRAP SPECIMEN MUCUS 40CC (MISCELLANEOUS) IMPLANT
TRAY FOLEY MTR SLVR 16FR STAT (SET/KITS/TRAYS/PACK) ×2 IMPLANT
TROCAR PORT AIRSEAL 5X120 (TROCAR) IMPLANT
UNDERPAD 30X36 HEAVY ABSORB (UNDERPADS AND DIAPERS) ×4 IMPLANT
WATER STERILE IRR 1000ML POUR (IV SOLUTION) ×2 IMPLANT
YANKAUER SUCT BULB TIP 10FT TU (MISCELLANEOUS) IMPLANT

## 2022-12-29 NOTE — Anesthesia Postprocedure Evaluation (Signed)
Anesthesia Post Note  Patient: Kimie Pidcock Neault  Procedure(s) Performed: XI ROBOTIC ASSISTED BILATERAL SALPINGO OOPHORECTOMY (Bilateral: Abdomen)     Patient location during evaluation: PACU Anesthesia Type: General Level of consciousness: awake and alert Pain management: pain level controlled Vital Signs Assessment: post-procedure vital signs reviewed and stable Respiratory status: spontaneous breathing, nonlabored ventilation, respiratory function stable and patient connected to nasal cannula oxygen Cardiovascular status: blood pressure returned to baseline and stable Postop Assessment: no apparent nausea or vomiting Anesthetic complications: no  No notable events documented.  Last Vitals:  Vitals:   12/29/22 1000 12/29/22 1015  BP: (!) 158/79 (!) 153/72  Pulse: (!) 49 (!) 54  Resp: 10 16  Temp:  36.6 C  SpO2: 100% 100%    Last Pain:  Vitals:   12/29/22 1015  TempSrc:   PainSc: 4                  Kaeden Mester L Iyanla Eilers

## 2022-12-29 NOTE — Discharge Instructions (Addendum)
AFTER SURGERY INSTRUCTIONS   Return to work: 4-6 weeks if applicable   Activity: 1. Be up and out of the bed during the day.  Take a nap if needed.  You Heying walk up steps but be careful and use the hand rail.  Stair climbing will tire you more than you think, you Gn[place] need to stop part way and rest.    2. No lifting or straining for 6 weeks over 10 pounds. No pushing, pulling, straining for 6 weeks.   3. No driving for around 1 week(s).  Do not drive if you are taking narcotic pain medicine and make sure that your reaction time has returned.    4. You can shower as soon as the next day after surgery. Shower daily.  Use your regular soap and water (not directly on the incision) and pat your incision(s) dry afterwards; don't rub.  No tub baths or submerging your body in water until cleared by your surgeon. If you have the soap that was given to you by pre-surgical testing that was used before surgery, you do not need to use it afterwards because this can irritate your incisions.    5. No sexual activity and nothing in the vagina for 4-6 weeks.   6. You [place] experience a small amount of clear drainage from your incisions, which is normal.  If the drainage persists, increases, or changes color please call the office.   7. Do not use creams, lotions, or ointments such as neosporin on your incisions after surgery until advised by your surgeon because they can cause removal of the dermabond glue on your incisions.     8. You [place] experience vaginal spotting after surgery.  The spotting is normal but if you experience heavy bleeding, call our office.   9. Take Tylenol or ibuprofen first for pain if you are able to take these medications and only use narcotic pain medication for severe pain not relieved by the Tylenol or Ibuprofen.  Monitor your Tylenol intake to a max of 4,000 mg in a 24 hour period. You can alternate these medications after surgery.   Diet: 1. Low sodium Heart Healthy Diet is  recommended but you are cleared to resume your normal (before surgery) diet after your procedure.   2. It is safe to use a laxative, such as Miralax or Colace, if you have difficulty moving your bowels. You have been prescribed Sennakot-S to take at bedtime every evening after surgery to keep bowel movements regular and to prevent constipation.     Wound Care: 1. Keep clean and dry.  Shower daily.   Reasons to call the Doctor: Fever - Oral temperature greater than 100.4 degrees Fahrenheit Foul-smelling vaginal discharge Difficulty urinating Nausea and vomiting Increased pain at the site of the incision that is unrelieved with pain medicine. Difficulty breathing with or without chest pain New calf pain especially if only on one side Sudden, continuing increased vaginal bleeding with or without clots.   Contacts: For questions or concerns you should contact:   Dr. Meredith Newton at [phone]   Melissa Cross, NP at [phone]   After Hours: call [phone] and have the GYN Oncologist paged/contacted (after 5 pm or on the weekends). You will speak with an after hours RN and let he or she know you have had surgery.   Messages sent via mychart are for non-urgent matters and are not responded to after hours so for urgent needs, please call the after hours number.   

## 2022-12-29 NOTE — Addendum Note (Signed)
Addendum  created 12/29/22 1347 by Sindy Guadeloupe, CRNA   [employer] properties accepted

## 2022-12-29 NOTE — H&P (Signed)
Brief Pre-operative History & Physical  Patient name: Abigail Hayes CSN: [id number] MRN: [id number] Admit Date: 12/29/2022 Date of Surgery: 12/29/2022 Performing Service: Gynecology   Code Status: Full Code    Assessment & Plan    Sion is a 62 y.o. female with ADNEXAL MASS, who presents for: Procedure(s) (LRB): XI ROBOTIC ASSISTED BILATERAL SALPINGO OOPHORECTOMY (Bilateral).   Consent obtained in office is accurate. Risks, benefits, and alternatives to surgery were reviewed, and all questions were answered.  Proceed to the OR as planned.     History of Present Illness:  Abigail Hayes is a 62 y.o. female with ADNEXAL MASS. She was recently seen in clinic, where a detailed HPI can be found. She was noted to benefit from: Procedure(s) (LRB): XI ROBOTIC ASSISTED BILATERAL SALPINGO OOPHORECTOMY (Bilateral).    Allergies Alpha-gal  Medications   Current Facility-Administered Medications  Medication Dose Route Frequency Provider Last Rate Last Admin   lactated ringers infusion   Intravenous Continuous Kaylyn Layer, MD 10 mL/hr at 12/29/22 0640 Continued from Pre-op at 12/29/22 0640   scopolamine (TRANSDERM-SCOP) 1 MG/3DAYS 1.5 mg  1 patch Transdermal On Call to OR Warner Mccreedy D, NP   1.5 mg at 12/29/22 0557    Vital Signs BP (!) 148/81   Pulse 60   Temp 97.9 F (36.6 C) (Oral)   Resp 17   Ht  (1.626 m)   Wt 158 lb (71.7 kg)   LMP 06/03/2011   SpO2 97%   BMI 27.12 kg/m  Facility age limit for growth %iles is 20 years. Facility age limit for growth %iles is 20 years..   Physical Exam General: Well developed, appears stated age, in no acute distress  Mental status: Alert and oriented x3 Cardiovascular: Normal Pulmonary: Symmetric chest rise, unlabored breathing Relevant System for Surgery: Surgical site examination deferred to the OR   Labs and Studies: Lab Results  Component Value Date   WBC 4.9 12/22/2022   HGB 13.8 12/22/2022   HCT 42.7 12/22/2022    PLT 323 12/22/2022    No results found for: "INR", "APTT" \ \

## 2022-12-29 NOTE — Transfer of Care (Signed)
Immediate Anesthesia Transfer of Care Note  Patient: Abigail Hayes  Procedure(s) Performed: XI ROBOTIC ASSISTED BILATERAL SALPINGO OOPHORECTOMY (Bilateral: Abdomen)  Patient Location: PACU  Anesthesia Type:General  Level of Consciousness: awake, alert , and patient cooperative  Airway & Oxygen Therapy: Patient Spontanous Breathing and Patient connected to face mask oxygen  Post-op Assessment: Report given to RN and Post -op Vital signs reviewed and stable  Post vital signs: Reviewed and stable  Last Vitals:  Vitals Value Taken Time  BP 139/88 12/29/22 0922  Temp    Pulse 66 12/29/22 0924  Resp 21 12/29/22 0924  SpO2 100 % 12/29/22 0924  Vitals shown include unvalidated device data.  Last Pain:  Vitals:   12/29/22 0614  TempSrc: Oral  PainSc:       Patients Stated Pain Goal: 3 (12/29/22 0555)  Complications: No notable events documented.

## 2022-12-29 NOTE — Op Note (Addendum)
GYNECOLOGIC ONCOLOGY OPERATIVE NOTE  Date of Service: 12/29/2022  Preoperative Diagnosis: Right ovarian mass, RLQ pain  Postoperative Diagnosis: Right ovarian fibroma  Procedures: Robotic assisted laparoscopic bilateral salpingo-oophorectomy  Surgeon: Clide Cliff, MD  Assistants: Antionette Char, MD and (an MD assistant was necessary for tissue manipulation, management of robotic instrumentation, retraction and positioning due to the complexity of the case and hospital policies)  Anesthesia: General  Estimated Blood Loss: Minimal  Fluids: See anesthesia record  Urine Output: , clear yellow  Findings: On entry to abdomen, normal upper abdominal survey with smooth diaphragm, liver, stomach and normal appearing omentum and bowel. In the pelvis, small anteverted uterus. Normal bilateral fallopian tubes and left ovary. Right ovary enlarged and solid. Thin filmy adhesions of the sigmoid to the left pelvic sidewall. IOFS consistent with benign stromal tumor, likely fibroma.   Specimens:  ID Type Source Tests Collected by Time Destination  1 : Right fallopian tube and ovary Tissue PATH Gyn biopsy SURGICAL PATHOLOGY Clide Cliff, MD 12/29/2022 0830   2 : Left fallopian tube and ovary Tissue PATH Gyn biopsy SURGICAL PATHOLOGY Clide Cliff, MD 12/29/2022 [phone]   A : pelvic washings Body Fluid PATH Cytology Pelvic Washing CYTOLOGY - NON PAP Clide Cliff, MD 12/29/2022 [phone]     Complications:  None  Indications for Procedure: Abigail Hayes is a 62 y.o. woman with a solid right ovarian mass, likely fibroma on prior imaging, slightly increased in size and with RLQ pain.  Prior to the procedure, all risks, benefits, and alternatives were discussed and informed surgical consent was signed.  Procedure: Patient was taken to the operating room where general anesthesia was achieved.  She was positioned in dorsal lithotomy and prepped and draped.  A foley catheter was inserted into  the bladder.  The cervix was dilated and a hulka manipulator was inserted to the uterus.    A 12 mm incision was made in the left upper quadrant near Palmer's point.  The abdomen was entered with a 5 mm OptiView trocar under direct visualization.  The abdomen was insufflated, the patient placed in steep Trendelenburg, and additional trocars were placed as follows: an 8mm robotic trocar superior to the umbilicus, an 8 mm robotic trocar in the right abdomen, and one 8 mm robotic trocar in the left abdomen.  The left upper quadrant trocar was removed and replaced with a 12 mm airseal trocar.  All trocars were placed under direct visualization.  The bowels were moved into the upper abdomen.  The DaVinci robotic surgical system was brought to the patient's bedside and docked.  Pelvic washings were obtained. The right retroperitoneum entered.  The right ureter was identified.  The right infundibulopelvic ligament was isolated, cauterized, and transected.  The broad ligament was incised to the uterine cornu.  The utero-ovarian ligament and the proximal fallopian tube were isolated, cauterized, and transected.  The same procedure was performed on the contralateral side.  Both specimens were placed into separate Endo-Catch bags. The airseal trocar was removed. The left adnexa was removed through through the LUQ incision in the bag. The right adnexa was elevated through the incision in the bag and removed in the bag in a few parts.   The pelvis was irrigated and all operative sites were found to be hemostatic.  All instruments were removed and the robot was taken from the patient's bedside. The fascia at the 12 mm incision was closed with 0 Vicryl with the assistance of a PMI device. The abdomen was  desufflated and all ports were removed. The skin at all incisions was closed with 4-0 Vicryl to reapproximate the subcutaneous tissue and 4-0 monocryl in a subcuticular fashion followed by surgical glue.  Patient tolerated  the procedure well. Sponge, lap, and instrument counts were correct.  No perioperative antibiotic prophylaxis was indicated for this procedure.  She was extubated and taken to the PACU in stable condition.  Clide Cliff, MD Gynecologic Oncology

## 2022-12-29 NOTE — Telephone Encounter (Signed)
I left patient a voicemail with the information below & advised her to call back with any questions. 

## 2022-12-29 NOTE — Anesthesia Procedure Notes (Addendum)
Procedure Name: Intubation Date/Time: 12/29/2022 7:41 AM  Performed by: Sindy Guadeloupe, CRNAPre-anesthesia Checklist: Patient identified, Emergency Drugs available, Suction available, Patient being monitored and Timeout performed Patient Re-evaluated:Patient Re-evaluated prior to induction Oxygen Delivery Method: Circle system utilized Preoxygenation: Pre-oxygenation with 100% oxygen Induction Type: IV induction Ventilation: Mask ventilation without difficulty Laryngoscope Size: Mac and 4 Grade View: Grade I Tube type: Oral Tube size: 7.0 mm Number of attempts: 1 Airway Equipment and Method: Stylet Placement Confirmation: ETT inserted through vocal cords under direct vision, positive ETCO2 and breath sounds checked- equal and bilateral Secured at: 21 cm Tube secured with: Tape Dental Injury: Teeth and Oropharynx as per pre-operative assessment  Comments: Easy intubation, Christena Flake, Paramedic student assisted with intubation while CRNA and MDA participating and supervising.

## 2022-12-29 NOTE — Telephone Encounter (Signed)
-----   Message from Betty G Swazil[place], MD sent at 12/29/2022 12:24 PM EDT ----- Regarding: Ear problems Her GI saw her recently and noted signs of external otitis. Can you please call her and ask her if she is having any earache or drainage. I can send ear drops if she does have any of these symptoms.  She was complaining of ear itching last visit and I recommended topical OTC hydrocortisone 1%. Thanks, BJ

## 2022-12-30 ENCOUNTER — Encounter (HOSPITAL_COMMUNITY): Payer: Self-pay | Admitting: Psychiatry

## 2022-12-30 ENCOUNTER — Telehealth: Payer: Self-pay

## 2022-12-30 LAB — SURGICAL PATHOLOGY

## 2022-12-30 LAB — CYTOLOGY - NON PAP

## 2022-12-30 NOTE — Telephone Encounter (Signed)
Spoke with Abigail Hayes this morning. She states she is eating, drinking and urinating well. She has not had a BM yet but is passing gas. She is taking senokot as prescribed and encouraged her to drink plenty of water. She denies fever or chills. Incisions are dry and intact. She rates her pain 3/10 when not moving, 7/10 when moving. Her pain is controlled with Tylenol/Ibuprofen    Instructed to call office with any fever, chills, purulent drainage, uncontrolled pain or any other questions or concerns. Patient verbalizes understanding.   Pt aware of post op appointments as well as the office number [phone] and after hours number [phone] to call if she has any questions or concerns

## 2022-12-31 ENCOUNTER — Telehealth: Payer: Self-pay | Admitting: Family Medicine

## 2022-12-31 NOTE — Telephone Encounter (Signed)
Pt called to say she received a bill for $350, for the OV dated 11/09/22.  Pt was seen by Dr. Swaziland.  Pt states an OV with Dr. Caryl Never is usually somewhere between $190-$200.  Pt states she already reached out to [facility] and was told Pt needed to reach out to provider.  Pt added that she already paid this bill, but now feels an error must have been made somewhere, and she would like to know if she was billed correctly or incorrectly?  Pt would a call back to discuss.

## 2023-01-02 DIAGNOSIS — H9213 Otorrhea, bilateral: Secondary | ICD-10-CM | POA: Diagnosis not present

## 2023-01-11 ENCOUNTER — Inpatient Hospital Stay (HOSPITAL_BASED_OUTPATIENT_CLINIC_OR_DEPARTMENT_OTHER): Payer: 59 | Admitting: Psychiatry

## 2023-01-11 VITALS — BP 139/74 | HR 78 | Temp 98.4°F | Wt 157.0 lb

## 2023-01-11 DIAGNOSIS — Z90722 Acquired absence of ovaries, bilateral: Secondary | ICD-10-CM

## 2023-01-11 DIAGNOSIS — D271 Benign neoplasm of left ovary: Secondary | ICD-10-CM

## 2023-01-11 DIAGNOSIS — N9489 Other specified conditions associated with female genital organs and menstrual cycle: Secondary | ICD-10-CM

## 2023-01-11 DIAGNOSIS — Z90710 Acquired absence of both cervix and uterus: Secondary | ICD-10-CM

## 2023-01-11 NOTE — Progress Notes (Unsigned)
Gynecologic Oncology Return Clinic Visit  Date of Service: 01/11/2023 Referring Provider: Dr. [name]   Assessment & Plan: Abigail Hayes is a 62 y.o. woman with an ovarian fibroma who is 2 weeks s/p RA-BSO on 12/29/22.  Postop: - Pt recovering well from surgery and healing appropriately postoperatively - Intraoperative findings and pathology results reviewed. - Ongoing postoperative expectations and precautions reviewed. Continue with no lifting >10lbs through 6 weeks postoperatively - Pt works ***. Okay to return to work at [employer] - Given that uterus is in situ, pt advised that she should continue with pap smear screening per routine until age 29 if she continues with negative/low grade paps.  - Reviewed that after 12 months without menstrual cycles, she should not have any spotting or bleeding.  If this were to occur, she should be evaluated for postmenopausal bleeding.  ***  ***VTE Prophylaxis: - Khorana score = ***  RTC ***.  Clide Cliff, MD Gynecologic Oncology   Medical Decision Making I personally spent  TOTAL *** minutes face-to-face and non-face-to-face in the care of this patient, which includes all pre, intra, and post visit time on the date of service.  *** minutes spent reviewing records prior to the visit *** Minutes in patient contact      *** minutes in other billable services *** minutes charting , conferring with consultants etc.   ----------------------- Reason for Visit: Postop***  Treatment History: Oncology History   No history exists.    Interval History: Pt reports that she is recovering well from surgery. She is using *** for pain. She is eating and drinking well. She is voiding without issue and having regular bowel movements.   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   Enlargement of lymph nodes 07/17/2009    Glaucoma    Osteoporosis 06/19/2020   Ovarian cyst 03/2021   Pre-diabetes    PSORIATIC ARTHRITIS 07/17/2009    Past Surgical History:  Procedure Laterality Date   ANTERIOR CRUCIATE LIGAMENT REPAIR Left 2004   COLONOSCOPY  2011   2011; 07-2020   Ovarian cyst removed  1990   ROBOTIC ASSISTED SALPINGO OOPHERECTOMY Bilateral 12/29/2022   Procedure: XI ROBOTIC ASSISTED BILATERAL SALPINGO OOPHORECTOMY;  Surgeon: Clide Cliff, MD;  Location: [facility];  Service: Gynecology;  Laterality: Bilateral;    Family History  Problem Relation Age of Onset   Breast cancer Mother    Endometrial cancer Mother    Cancer Father        ?tonsillar cancer   Esophageal cancer Father    Ovarian cancer Paternal Aunt    Colon cancer Other    Hyperlipidemia Other    Hypertension Other    Heart disease Other    Prostate cancer Other    Pancreatic cancer Other    Colon polyps Neg Hx    Stomach cancer Neg Hx    Rectal cancer Neg Hx     Social History   Socioeconomic History   Marital status: Married    Spouse name: Not on file   Number of children: 3   Years of education: Not on file   Highest education level: Master's degree (e.g., MA, MS, MEng, MEd, MSW, MBA)  Occupational History   Occupation: Designer, television/film set: [employer]  Tobacco Use   Smoking status: Never   Smokeless tobacco: Never  Vaping Use   Vaping Use:  Never used  Substance and Sexual Activity   Alcohol use: Yes    Comment: 1-2 glases of wine with dinner daily   Drug use: No   Sexual activity: Yes    Birth control/protection: Post-menopausal    Comment: Vasectomy  Other Topics Concern   Not on file  Social History Narrative   Right handed   Two story home   Drinks caffeine   Social Determinants of Health   Financial Resource Strain: Low Risk  (11/05/2022)   Overall Financial Resource Strain (CARDIA)    Difficulty of Paying Living Expenses: Not very hard  Food Insecurity: No Food Insecurity (11/05/2022)   Hunger  Vital Sign    Worried About Running Out of Food in the Last Year: Never true    Ran Out of Food in the Last Year: Never true  Transportation Needs: No Transportation Needs (11/05/2022)   PRAPARE - Transportation    Lack of Transportation (Medical): No    Lack of Transportation (Non-Medical): No  Physical Activity: Sufficiently Active (11/05/2022)   Exercise Vital Sign    Days of Exercise per Week: 5 days    Minutes of Exercise per Session: 40 min  Stress: No Stress Concern Present (11/05/2022)   [employer] of Occupational Health - Occupational Stress Questionnaire    Feeling of Stress : Only a little  Social Connections: Moderately Integrated (11/05/2022)   Social Connection and Isolation Panel [NHANES]    Frequency of Communication with Friends and Family: More than three times a week    Frequency of Social Gatherings with Friends and Family: More than three times a week    Attends Religious Services: Never    Database administrator or Organizations: Yes    Attends Engineer, structural: More than 4 times per year    Marital Status: Married    Current Medications:  Current Outpatient Medications:    alendronate (FOSAMAX) 70 MG tablet, Take 70 mg by mouth once a week., Disp: , Rfl:    EPINEPHrine (EPIPEN 2-PAK) 0.3 mg/0.3 mL IJ SOAJ injection, EpiPen 2-Pak 0.3 mg/0.3 mL injection, auto-injector, Disp: 1 each, Rfl: 1   ibuprofen (ADVIL,MOTRIN) 200 MG tablet, Take 200-400 mg by mouth every 6 (six) hours as needed for pain., Disp: , Rfl:    latanoprost (XALATAN) 0.005 % ophthalmic solution, Place 1 drop into both eyes at bedtime., Disp: , Rfl:   Review of Symptoms: Complete 10-system review is negative except as above in Interval History.  Physical Exam: LMP 06/03/2011  General: ***Alert, oriented, no acute distress. HEENT: ***Normocephalic, atraumatic. Neck symmetric without masses. Sclera anicteric.  Chest: ***Normal work of breathing. ***Clear to auscultation  bilaterally.   Cardiovascular: ***Regular rate and rhythm, no murmurs. Abdomen: ***Soft, nontender.  Normoactive bowel sounds.  No masses appreciated.  ***Well-healing incision***s. Extremities: ***Grossly normal range of motion.  Warm, well perfused.  No edema bilaterally. Skin: ***No rashes or lesions noted. GU: Normal appearing external genitalia without erythema, excoriation, or lesions.  Speculum exam reveals ***.  Bimanual exam reveals ***. Exam chaperoned by ***  Laboratory & Radiologic Studies: ***

## 2023-01-11 NOTE — Patient Instructions (Signed)
It was a pleasure to see you in clinic today. - Okay to resume routine ob/gyn care - Okay to resume normal activities, gradually reintroduce.  Thank you very much for allowing me to provide care for you today.  I appreciate your confidence in choosing our Gynecologic Oncology team at [facility].  If you have any questions about your visit today please call our office or send [place] a MyChart message and we will get back to you as soon as possible.

## 2023-01-14 ENCOUNTER — Encounter: Payer: Self-pay | Admitting: Psychiatry

## 2023-02-11 ENCOUNTER — Telehealth: Payer: Self-pay | Admitting: Family Medicine

## 2023-02-11 ENCOUNTER — Other Ambulatory Visit: Payer: Self-pay | Admitting: Family

## 2023-02-11 DIAGNOSIS — H60543 Acute eczematoid otitis externa, bilateral: Secondary | ICD-10-CM

## 2023-02-11 NOTE — Telephone Encounter (Signed)
Pt called to say the Dermatologist (Dr. Alfonse Spruce) MD referred her to is no longer accepting new patients and does not accept [employer].  Pt is requesting a new referral to a Dermatologist that is accepting new patients, as well as Pts with [employer].

## 2023-02-12 NOTE — Telephone Encounter (Signed)
Patient informed that referral has been placed.

## 2023-02-17 ENCOUNTER — Ambulatory Visit (INDEPENDENT_AMBULATORY_CARE_PROVIDER_SITE_OTHER): Payer: 59 | Admitting: Family Medicine

## 2023-02-17 ENCOUNTER — Encounter: Payer: Self-pay | Admitting: Family Medicine

## 2023-02-17 VITALS — BP 144/90 | HR 68 | Temp 98.4°F | Ht 64.0 in | Wt 159.5 lb

## 2023-02-17 DIAGNOSIS — R21 Rash and other nonspecific skin eruption: Secondary | ICD-10-CM | POA: Diagnosis not present

## 2023-02-17 MED ORDER — PREDNISONE 10 MG PO TABS: ORAL_TABLET | ORAL | 0 refills | Status: DC

## 2023-02-17 MED ORDER — MOMETASONE FUROATE 0.1 % EX SOLN: Freq: Every day | CUTANEOUS | 1 refills | Status: DC

## 2023-02-17 NOTE — Progress Notes (Signed)
Established Patient Office Visit  Subjective   Patient ID: Lucienne Ambs Housh, female    DOB: [birth year]  Age: 62 y.o. MRN: [id number]  Chief Complaint  Patient presents with   Facial Swelling    Patient complains of eye swelling, x4 days    Ear Pain    HPI   Abigail Hayes is seen battling recent inflammatory rash involving both ears and now more recently around the right eye.  She has chronic history of psoriatic arthritis as well as osteoporosis.  She is followed by rheumatology and just recently went back on Cosentyx after break off of this.  She had arthritis issues for several years but has had really no significant skin manifestations until recently.  She went to urgent care back in April and was given some kind of steroid drops for her ear which did help.  She had apparently bacterial culture which came back negative.  She has significant oozing and scabbing of both external ears.  She has had similar type of oozing drainage around the right eye.  She tried some saline drops given to her from ophthalmologist which have not helped much.  Denies any other skin rash.  Has seen dermatologist in the past but could not get back into see them.  She was referred recently but has not heard anything yet.  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 She has never used smokeless tobacco. She reports current alcohol use. She reports that she does not use drugs. family history includes Breast cancer in her mother; Cancer in her father; Colon cancer in an other family member; Endometrial cancer in her mother; Esophageal cancer in her father; Heart disease in an other family member; Hyperlipidemia in an other family member; Hypertension in an other family member; Ovarian cancer in her paternal aunt; Pancreatic cancer in an other family member; Prostate cancer in an other family member. Allergies  Allergen Reactions   Alpha-Gal Anaphylaxis, Hives and Cough    Galactose-Alpha-1,3-Galactose  Gelatin    Review of Systems  Constitutional:  Negative for chills and fever.  Eyes:  Negative for blurred vision, pain and redness.  Skin:  Positive for rash.      Objective:     BP (!) 144/90 (BP Location: Left Arm, Patient Position: Sitting, Cuff Size: Normal)   Pulse 68   Temp 98.4 F (36.9 C)   Ht 5\' 4"  (1.626 m)   Wt 159 lb 8 oz (72.3 kg)   LMP 06/03/2011   SpO2 99%   BMI 27.38 kg/m  BP Readings from Last 3 Encounters:  02/17/23 (!) 144/90  01/11/23 139/74  12/29/22 (!) 153/69   Wt Readings from Last 3 Encounters:  02/17/23 159 lb 8 oz (72.3 kg)  01/11/23 157 lb (71.2 kg)  12/29/22 158 lb (71.7  kg)      Physical Exam Vitals reviewed.  Constitutional:      Appearance: Normal appearance.  Cardiovascular:     Rate and Rhythm: Normal rate and regular rhythm.  Pulmonary:     Effort: Pulmonary effort is normal.     Breath sounds: Normal breath sounds.  Skin:    Findings: Rash present.     Comments: Patient has significant rash involving the external ear canals bilaterally.  She has some mild erythema and weeping/oozing drainage which is clear and serous like.  Significant scaling.  Similar findings right periocular region involving upper and lower lid.  Conjunctive appears normal.  Neurological:     Mental Status: She  is alert.      No results found for any visits on 02/17/23.    The 10-year ASCVD risk score (Arnett DK, et al., 2019) is: 4.8%    Assessment & Plan:   Bilateral auricular rash and right periocular rash.  Known history of psoriatic arthritis.  Suspect her current rash is probably related to psoriasis.  She did recently go back on Cosentyx which hopefully will help. -We discussed short-term prednisone taper over the next 8 days to try to settle down her significant inflammation -Watch closely for any signs of secondary infection such as cellulitis -Elocon lotion 2 to 3 drops each external canal once daily as needed -We will follow-up her dermatology referral to see if this could be expedited  Evelena Peat, MD

## 2023-03-07 ENCOUNTER — Encounter: Payer: Self-pay | Admitting: Family Medicine

## 2023-03-08 MED ORDER — PREDNISONE 10 MG PO TABS: ORAL_TABLET | ORAL | 0 refills | Status: DC

## 2023-03-09 NOTE — Telephone Encounter (Signed)
Noted  

## 2023-04-02 ENCOUNTER — Ambulatory Visit (INDEPENDENT_AMBULATORY_CARE_PROVIDER_SITE_OTHER): Payer: 59 | Admitting: Family Medicine

## 2023-04-02 ENCOUNTER — Encounter: Payer: Self-pay | Admitting: Family Medicine

## 2023-04-02 VITALS — BP 140/80 | HR 75 | Temp 98.4°F | Ht 64.0 in | Wt 161.7 lb

## 2023-04-02 DIAGNOSIS — M25571 Pain in right ankle and joints of right foot: Secondary | ICD-10-CM | POA: Diagnosis not present

## 2023-04-02 DIAGNOSIS — G8929 Other chronic pain: Secondary | ICD-10-CM | POA: Diagnosis not present

## 2023-04-02 DIAGNOSIS — R21 Rash and other nonspecific skin eruption: Secondary | ICD-10-CM

## 2023-04-02 MED ORDER — METHYLPREDNISOLONE ACETATE 80 MG/ML IJ SUSP
80.0000 mg | Freq: Once | INTRAMUSCULAR | Status: AC
Start: 2023-04-02 — End: 2023-04-02
  Administered 2023-04-02: 80 mg via INTRAMUSCULAR

## 2023-04-02 NOTE — Progress Notes (Unsigned)
Established Patient Office Visit  Subjective   Patient ID: Abigail Hayes, female    DOB: [birth year]  Age: 62 y.o. 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  She had tremendous problems recently with periocular rash right greater than left but also some significant well-demarcated rash around the external ear bilaterally and more recently nares in the nose.  She sees rheumatologist regularly.  She is currently on Cosentyx.  Recently went on this.  She was treated with prednisone here initially for her eye rash and this did seem to improve.  She had some semblance of contact dermatitis initially.  She had a second course of prednisone which did not seem to help much.  She tried low potency topical steroid around the right eye without much improvement.  She is having right ankle pain now which she has had for years.  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  There is consideration for surgery but she declined.  She has not had any recent ankle swelling but has ongoing pain especially with walking.  No erythema or warmth.  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 She has never used smokeless tobacco. She reports current alcohol use. She reports that she does not use drugs. family history includes Breast cancer in her mother; Cancer in her father; Colon cancer in an other family member; Endometrial cancer in her mother; Esophageal cancer in her father; Heart disease in an other family member; Hyperlipidemia in an other family member; Hypertension in an other family member; Ovarian cancer in her paternal aunt; Pancreatic cancer in an other family member; Prostate cancer in an other family member. Allergies  Allergen Reactions   Alpha-Gal Anaphylaxis, Hives and Cough    Galactose-Alpha-1,3-Galactose  Gelatin    Review of Systems  Constitutional:  Negative for chills and fever.  Respiratory:  Negative for cough.   Cardiovascular:  Negative for chest pain.  Musculoskeletal:  Positive for joint pain.      Objective:     BP (!) 140/80 (BP Location: Left Arm, Patient Position: Sitting, Cuff Size: Normal)   Pulse 75   Temp 98.4 F (36.9 C) (Oral)   Ht 5\' 4"  (1.626 m)   Wt 161 lb 11.2 oz (73.3 kg)   LMP 06/03/2011   SpO2 98%   BMI 27.76 kg/m  {Vitals History (Optional):23777}  Physical Exam Vitals reviewed.  HENT:  Head: Normocephalic.  Pulmonary:     Effort: Pulmonary effort is normal.     Breath sounds: Normal breath sounds.  Musculoskeletal:     Comments: Right ankle reveals no effusion.  She has fairly good range of motion.  No warmth or erythema.  Mild anterior joint tenderness  Skin:    Findings: Rash present.     Comments: She has significant erythema and swelling with some scaly changes around the right eye with lesser involvement of the left eye.  She has very  well-demarcated rash involving external ear bilaterally and also some similar thick scaly rash around the nares bilaterally  Neurological:     Mental Status: She is alert.      No results found for any visits on 04/02/23.  {Labs (Optional):23779}  The 10-year ASCVD risk score (Arnett DK, et al., 2019) is: 4.5%    Assessment & Plan:   #1 chronic ankle pain with previous mention of loose bodies on prior MRI scan.  He has been reluctant to consider surgery in the past.  We did mention other option of sports medicine follow-up at some point if she would like to consider nonsurgical options.  #2 skin rash involving ears, eyes, nose.  Suspect related to her psoriasis.  Suspect will take more systemic treatment to get this under control.  We did agree to Depo-Medrol 80 mg IM and she will be consulting with her rheumatologist regarding her injection therapy.  She also has pending follow-up with dermatologist soon   No follow-ups on file.    Evelena Peat, MD

## 2023-04-14 ENCOUNTER — Telehealth: Payer: Self-pay | Admitting: Gastroenterology

## 2023-04-14 NOTE — Telephone Encounter (Signed)
Inbound call from patient stating she was advised to call back in August to schedule imaging of abdomen and pelvis. Patient requesting a call back to have this scheduled. Please advise, thank you.

## 2023-04-15 NOTE — Telephone Encounter (Signed)
Dr Myrtie Neither, I spoke to Abigail Hayes. She states she is due to have abdominal imaging, that on her last office visit you wanted to her to wait a few months after her recent abdominal surgery. Can you please verify the order as she wants to make sure we include the pelvis as she has been having lower abdominal pain in addition to the RUQ pain.

## 2023-04-20 ENCOUNTER — Other Ambulatory Visit: Payer: Self-pay

## 2023-04-20 DIAGNOSIS — R1031 Right lower quadrant pain: Secondary | ICD-10-CM

## 2023-04-20 DIAGNOSIS — R1011 Right upper quadrant pain: Secondary | ICD-10-CM

## 2023-04-20 NOTE — Telephone Encounter (Signed)
Thank you for the note.  Please schedule CT scan abdomen and pelvis with oral and IV contrast for right sided abdominal pain.  - HD

## 2023-04-20 NOTE — Telephone Encounter (Signed)
Pts CT has been sched as requested on  05/04/23 @ 230pm @ [facility].

## 2023-04-20 NOTE — Telephone Encounter (Signed)
CT scan has been ordered. Order sent for radiology to call her and schedule. Info sent to patient via mychart.

## 2023-04-22 ENCOUNTER — Telehealth: Payer: Self-pay

## 2023-04-22 DIAGNOSIS — Z79899 Other long term (current) drug therapy: Secondary | ICD-10-CM | POA: Diagnosis not present

## 2023-04-22 DIAGNOSIS — Z6827 Body mass index (BMI) 27.0-27.9, adult: Secondary | ICD-10-CM | POA: Diagnosis not present

## 2023-04-22 DIAGNOSIS — L409 Psoriasis, unspecified: Secondary | ICD-10-CM | POA: Diagnosis not present

## 2023-04-22 DIAGNOSIS — E663 Overweight: Secondary | ICD-10-CM | POA: Diagnosis not present

## 2023-04-22 DIAGNOSIS — L4059 Other psoriatic arthropathy: Secondary | ICD-10-CM | POA: Diagnosis not present

## 2023-04-22 DIAGNOSIS — M25511 Pain in right shoulder: Secondary | ICD-10-CM | POA: Diagnosis not present

## 2023-04-22 DIAGNOSIS — E559 Vitamin D deficiency, unspecified: Secondary | ICD-10-CM | POA: Diagnosis not present

## 2023-04-22 DIAGNOSIS — R195 Other fecal abnormalities: Secondary | ICD-10-CM | POA: Diagnosis not present

## 2023-04-22 NOTE — Telephone Encounter (Signed)
Left message for return call.

## 2023-04-22 NOTE — Telephone Encounter (Signed)
-----   Message from April M sent at 04/22/2023  2:12 PM EDT ----- Regarding: POS CHANGED Sheralyn Boatman, Hello :-) Would you let this patient know her insurance would NOT approve any hospital facility. They only gave me options closest to the pt's address. I did get her CT scan approved at the following site:  [facility]  [address] - [address] [place], [place] [address]  Thank you.

## 2023-05-03 NOTE — Telephone Encounter (Signed)
Patient returned call and agrees with current plan of changing locations for the CT. Order has been faxed to  [phone]. They will reach out to her to schedule.

## 2023-05-04 ENCOUNTER — Ambulatory Visit (HOSPITAL_COMMUNITY): Payer: 59

## 2023-05-04 DIAGNOSIS — L400 Psoriasis vulgaris: Secondary | ICD-10-CM | POA: Diagnosis not present

## 2023-05-19 DIAGNOSIS — R1011 Right upper quadrant pain: Secondary | ICD-10-CM | POA: Diagnosis not present

## 2023-05-24 ENCOUNTER — Encounter: Payer: Self-pay | Admitting: Family Medicine

## 2023-05-24 DIAGNOSIS — R935 Abnormal findings on diagnostic imaging of other abdominal regions, including retroperitoneum: Secondary | ICD-10-CM

## 2023-05-24 DIAGNOSIS — R911 Solitary pulmonary nodule: Secondary | ICD-10-CM

## 2023-05-26 ENCOUNTER — Telehealth: Payer: Self-pay | Admitting: Gastroenterology

## 2023-05-26 DIAGNOSIS — H401121 Primary open-angle glaucoma, left eye, mild stage: Secondary | ICD-10-CM | POA: Diagnosis not present

## 2023-05-26 DIAGNOSIS — H401114 Primary open-angle glaucoma, right eye, indeterminate stage: Secondary | ICD-10-CM | POA: Diagnosis not present

## 2023-05-26 DIAGNOSIS — H16143 Punctate keratitis, bilateral: Secondary | ICD-10-CM | POA: Diagnosis not present

## 2023-05-26 DIAGNOSIS — L4052 Psoriatic arthritis mutilans: Secondary | ICD-10-CM | POA: Diagnosis not present

## 2023-05-26 DIAGNOSIS — H2513 Age-related nuclear cataract, bilateral: Secondary | ICD-10-CM | POA: Diagnosis not present

## 2023-05-26 NOTE — Telephone Encounter (Signed)
Inbound call from patient stating that she got her CT scan results and is looking for Dr. Myrtie Neither comments and next steps. She also wants to discuss her abdominal pain, when you press on her right side of belly button she has acute pain. Please advise.

## 2023-05-27 ENCOUNTER — Encounter: Payer: Self-pay | Admitting: Gastroenterology

## 2023-06-08 ENCOUNTER — Ambulatory Visit: Payer: 59 | Admitting: Family Medicine

## 2023-06-09 ENCOUNTER — Ambulatory Visit: Payer: 59 | Admitting: Family Medicine

## 2023-06-09 NOTE — Telephone Encounter (Signed)
I went ahead and ordered CT chest, and hopefully insurance will approve.  Kristian Covey MD [facility]

## 2023-06-14 NOTE — Telephone Encounter (Signed)
Pt insurance will not cover [facility] please sent to [facility] in [place]

## 2023-06-22 NOTE — Addendum Note (Signed)
Addended by: Vickii Chafe on: 06/22/2023 09:26 AM   Modules accepted: Orders

## 2023-06-25 ENCOUNTER — Telehealth: Payer: Self-pay | Admitting: Family Medicine

## 2023-06-25 NOTE — Telephone Encounter (Signed)
Pt says facility is asking that the order for her CT scan ordered by Panosh be signed.

## 2023-06-28 NOTE — Telephone Encounter (Signed)
I spoke with Selena Batten with [facility] and she reported that this order can be signed electronically by Dr. Fabian Sharp or have the order printed and signed and faxed to [phone]

## 2023-06-29 NOTE — Telephone Encounter (Signed)
Order faxed.

## 2023-07-05 ENCOUNTER — Telehealth: Payer: Self-pay | Admitting: Family Medicine

## 2023-07-05 NOTE — Telephone Encounter (Signed)
Done

## 2023-07-05 NOTE — Telephone Encounter (Signed)
Meghan with [facility]   [phone]   Case # [id number] Pt is scheduled for 07/08/23.  They still need OV notes, ASAP!!  Please upload to [facility]

## 2023-07-15 DIAGNOSIS — E559 Vitamin D deficiency, unspecified: Secondary | ICD-10-CM | POA: Diagnosis not present

## 2023-07-15 DIAGNOSIS — E663 Overweight: Secondary | ICD-10-CM | POA: Diagnosis not present

## 2023-07-15 DIAGNOSIS — Z79899 Other long term (current) drug therapy: Secondary | ICD-10-CM | POA: Diagnosis not present

## 2023-07-15 DIAGNOSIS — R195 Other fecal abnormalities: Secondary | ICD-10-CM | POA: Diagnosis not present

## 2023-07-15 DIAGNOSIS — J849 Interstitial pulmonary disease, unspecified: Secondary | ICD-10-CM | POA: Diagnosis not present

## 2023-07-15 DIAGNOSIS — Z6827 Body mass index (BMI) 27.0-27.9, adult: Secondary | ICD-10-CM | POA: Diagnosis not present

## 2023-07-15 DIAGNOSIS — L4059 Other psoriatic arthropathy: Secondary | ICD-10-CM | POA: Diagnosis not present

## 2023-07-15 DIAGNOSIS — M25511 Pain in right shoulder: Secondary | ICD-10-CM | POA: Diagnosis not present

## 2023-07-15 DIAGNOSIS — L409 Psoriasis, unspecified: Secondary | ICD-10-CM | POA: Diagnosis not present

## 2023-07-16 ENCOUNTER — Encounter: Payer: Self-pay | Admitting: Internal Medicine

## 2023-07-18 NOTE — Progress Notes (Signed)
Forwarding to PCP I dont think I have seen this patient nor ordered this scan .

## 2023-07-20 ENCOUNTER — Telehealth: Payer: Self-pay | Admitting: Family Medicine

## 2023-07-20 NOTE — Telephone Encounter (Signed)
error 

## 2023-07-20 NOTE — Telephone Encounter (Deleted)
Pt returning call for results

## 2023-07-21 ENCOUNTER — Encounter: Payer: Self-pay | Admitting: Family Medicine

## 2023-07-21 ENCOUNTER — Ambulatory Visit (INDEPENDENT_AMBULATORY_CARE_PROVIDER_SITE_OTHER): Payer: 59 | Admitting: Family Medicine

## 2023-07-21 VITALS — BP 150/80 | HR 60 | Temp 98.3°F | Ht 64.0 in | Wt 158.0 lb

## 2023-07-21 DIAGNOSIS — R030 Elevated blood-pressure reading, without diagnosis of hypertension: Secondary | ICD-10-CM | POA: Diagnosis not present

## 2023-07-21 DIAGNOSIS — R911 Solitary pulmonary nodule: Secondary | ICD-10-CM | POA: Diagnosis not present

## 2023-07-21 DIAGNOSIS — R918 Other nonspecific abnormal finding of lung field: Secondary | ICD-10-CM

## 2023-07-21 NOTE — Progress Notes (Unsigned)
Established Patient Office Visit  Subjective   Patient ID: Abigail Hayes, female    DOB: 1960-12-13  Age: 62 y.o. MRN: [id number]  Chief Complaint  Patient presents with   Results   Medical Management of Chronic Issues    HPI  {History (Optional):23778} Abigail Hayes has past medical history significant for psoriatic arthritis, osteoporosis, right lumbosacral radiculopathy.  She stays very active She had CT abdomen and pelvis back in September which showed 5 mm nodule right middle lobe.  We obtained CT chest to further evaluate and this was done a week ago.  This showed 4 mm right middle lobe nodule felt to be low risk with no history of smoking but recommended 1 year follow-up.  There is also findings suggestive of fibrotic interstitial lung disease.  She denies any significant dyspnea.  She has had some intermittent cough in the past but no persistent chronic cough.  Is followed closely by rheumatology and currently on Skyrizi for her psoriatic arthritis.  She states her skin findings are improved but she has had less control of her arthritis.  Also apparently taking compounded GLP-1 medication per her rheumatologist.  She has had some chronic intermittent abdominal pains right lower quadrant has seen both GYN and gastroenterologist.  Her colonoscopy is up-to-date (11/21).  CT scan recently showed no acute findings or concerns.  She states she has had several years of pain in her right mid to lower quadrant.  Recent appetite and weight stable.  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 She has never used smokeless tobacco. She reports current alcohol use. She reports that she does not use drugs. family history includes Breast cancer in her mother; Cancer in her father; Colon cancer in an other family member; Endometrial cancer in her mother; Esophageal cancer in her father; Heart disease in an other family member; Hyperlipidemia in an other family member; Hypertension in an other family member; Ovarian cancer in her paternal aunt; Pancreatic cancer in an other family member; Prostate cancer in an other family member. Allergies  Allergen Reactions   Alpha-Gal Anaphylaxis, Hives and Cough    Galactose-Alpha-1,3-Galactose  Gelatin    Review of Systems  Constitutional:  Negative for chills, fever and malaise/fatigue.  Eyes:  Negative for blurred vision.  Respiratory:  Negative for cough, hemoptysis, sputum production, shortness of breath and wheezing.   Cardiovascular:  Negative for chest pain and leg swelling.  Gastrointestinal:  Positive for abdominal pain. Negative for blood in stool, nausea and vomiting.  Neurological:  Negative for dizziness, weakness and headaches.      Objective:     BP (!) 150/80 (BP Location: Left Arm, Patient Position: Sitting, Cuff Size: Normal)   Pulse 60   Temp 98.3 F (36.8 C) (Oral)   Ht 5\' 4"  (1.626 m)  Wt 158 lb (71.7 kg)   LMP 06/03/2011   SpO2 98%   BMI 27.12 kg/m  BP Readings from Last 3 Encounters:  07/21/23 (!) 150/80  04/05/23 (!) 140/80  02/17/23 (!) 144/90   Wt Readings from Last 3 Encounters:  07/21/23 158 lb (71.7 kg)  04/02/23 161 lb 11.2 oz (73.3 kg)  02/17/23 159 lb 8 oz (72.3 kg)      Physical Exam Vitals reviewed.  Constitutional:      General:  She is not in acute distress. Cardiovascular:     Rate and Rhythm: Normal rate and regular rhythm.  Pulmonary:     Effort: Pulmonary effort is normal.     Breath sounds: Normal breath sounds.  Musculoskeletal:     Right lower leg: No edema.     Left lower leg: No edema.  Neurological:     Mental Status: She is alert.      No results found for any visits on 07/21/23.  Last CBC Lab Results  Component Value Date   WBC 4.9 12/22/2022   HGB 13.8 12/22/2022   HCT 42.7 12/22/2022   MCV 98.2 12/22/2022   MCH 31.7 12/22/2022   RDW 12.7 12/22/2022   PLT 323 12/22/2022   Last metabolic panel Lab Results  Component Value Date   GLUCOSE 98 12/22/2022   NA 137 12/22/2022   K 4.7 12/22/2022   CL 105 12/22/2022   CO2 24 12/22/2022   BUN 16 12/22/2022   CREATININE 0.68 12/22/2022   GFRNONAA >60 12/22/2022   CALCIUM 9.0 12/22/2022   PROT 7.9 12/22/2022   ALBUMIN 4.0 12/22/2022   BILITOT 0.6 12/22/2022   ALKPHOS 78 12/22/2022   AST 17 12/22/2022   ALT 16 12/22/2022   ANIONGAP 8 12/22/2022   Last lipids Lab Results  Component Value Date   CHOL 241 (H) 06/12/2020   HDL 77 06/12/2020   LDLCALC 142 (H) 06/12/2020   LDLDIRECT 100.5 11/01/2013   TRIG 105 06/12/2020   CHOLHDL 3.1 06/12/2020   Last thyroid functions Lab Results  Component Value Date   TSH 2.56 06/12/2020      The ASCVD Risk score (Arnett DK, et al., 2019) failed to calculate for the following reasons:   Cannot find a previous HDL lab   Cannot find a previous total cholesterol lab    Assessment & Plan:   Problem List Items Addressed This Visit   None Visit Diagnoses     Abnormal CT scan of lung    -  Primary   Relevant Orders   Ambulatory referral to Pulmonology      Presents with recent abnormal CT lung scan findings including right middle lobe 5 mm nodule which is felt to be low risk with no history of smoking.  Discussed possible follow-up in 1 year  She has recent abnormal CT in terms of  possible fibrotic interstitial lung disease findings.  Does have autoimmune disease with psoriatic arthritis and currently treated with Skyrizi. -Recommend setting up pulmonary referral for further evaluation  No follow-ups on file.    Evelena Peat, MD

## 2023-07-28 DIAGNOSIS — D225 Melanocytic nevi of trunk: Secondary | ICD-10-CM | POA: Diagnosis not present

## 2023-07-28 DIAGNOSIS — L814 Other melanin hyperpigmentation: Secondary | ICD-10-CM | POA: Diagnosis not present

## 2023-07-28 DIAGNOSIS — L821 Other seborrheic keratosis: Secondary | ICD-10-CM | POA: Diagnosis not present

## 2023-10-01 ENCOUNTER — Encounter: Payer: Self-pay | Admitting: Emergency Medicine

## 2023-10-01 ENCOUNTER — Telehealth: Payer: Self-pay

## 2023-10-01 NOTE — Telephone Encounter (Signed)
Copied from CRM [id number]. Topic: General - Other >> Oct 01, 2023 11:13 AM Prudencio Pair wrote: Reason for CRM: Patient states she has upcoming appt with Dr. Delton Coombes on Wednesday, 10/06/23. She is needing to know how to get the chest scans (not report) sent to Dr. Delton Coombes. They are requesting those before her appt. Please give patient a call back if this can be completed. CB #: [phone].

## 2023-10-04 NOTE — Telephone Encounter (Signed)
Left a message for the patient to return my call.  

## 2023-10-06 ENCOUNTER — Encounter: Payer: Self-pay | Admitting: Emergency Medicine

## 2023-10-06 ENCOUNTER — Ambulatory Visit (INDEPENDENT_AMBULATORY_CARE_PROVIDER_SITE_OTHER): Payer: 59 | Admitting: Emergency Medicine

## 2023-10-06 VITALS — BP 120/64 | HR 90 | Ht 64.0 in | Wt 151.8 lb

## 2023-10-06 DIAGNOSIS — Z91018 Allergy to other foods: Secondary | ICD-10-CM | POA: Diagnosis not present

## 2023-10-06 DIAGNOSIS — L4050 Arthropathic psoriasis, unspecified: Secondary | ICD-10-CM

## 2023-10-06 DIAGNOSIS — R911 Solitary pulmonary nodule: Secondary | ICD-10-CM | POA: Diagnosis not present

## 2023-10-06 DIAGNOSIS — J849 Interstitial pulmonary disease, unspecified: Secondary | ICD-10-CM | POA: Diagnosis not present

## 2023-10-06 NOTE — Assessment & Plan Note (Addendum)
Again this was present in 2014 and the does not appear based on her most recent reports that there has been a significant interval change.  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  I did reassure her that her course has been inconsistent with IPF.  She needs surveillance imaging to ensure no significant change that would correlate with an effective control of her autoimmune disease, drug reaction, opportunistic infection, etc.  We will obtain a repeat high-resolution CT chest and also get baseline PFT  Interstitial Lung Disease Evidence of interstitial changes at both lung bases, possibly related to psoriatic arthritis. No current respiratory symptoms. -Order pulmonary function tests to establish baseline lung function. -Consider potential for autoimmune inflammatory process causing lung tissue inflammation.

## 2023-10-06 NOTE — Telephone Encounter (Signed)
Patient has contacted [facility] where CT scan was performed and has put in a request for CT scan images. Nothing further needed at this time.

## 2023-10-06 NOTE — Assessment & Plan Note (Signed)
Psoriatic Arthritis Currently managed with Skyrizi. -Continue current treatment regimen.

## 2023-10-06 NOTE — Assessment & Plan Note (Signed)
Given her alpha gal allergy, her seasonal allergies and some of her symptoms that include cough, wheeze we will perform PFT to ensure no evidence for underlying obstructive lung disease.

## 2023-10-06 NOTE — Patient Instructions (Signed)
VISIT SUMMARY:  During today's visit, we discussed the findings from your recent chest imaging, which showed a small nodule in your right lung and some changes in the lung tissue. We also reviewed your ongoing treatment for psoriatic arthritis and your general health maintenance.  YOUR PLAN:  -PULMONARY NODULE: A pulmonary nodule is a small, round growth in the lung. Your 4mm nodule has been stable since 2014, and there are no risk factors for lung cancer. We will continue to monitor it with another CT scan in 6 months.  -INTERSTITIAL LUNG DISEASE: Interstitial lung disease involves inflammation and scarring of the lung tissue. This Labella be related to your psoriatic arthritis. We will order pulmonary function tests to check your lung function and consider if an autoimmune process is causing the inflammation.  -PSORIATIC ARTHRITIS: Psoriatic arthritis is a type of arthritis that affects some people with psoriasis. You are currently being treated with Skyrizi, and we will continue with this treatment.  -GENERAL HEALTH MAINTENANCE: We discussed the possibility of alpha-gal sensitivity causing asthma-like symptoms and the potential for methotrexate-induced lung disease. Continue regular follow-ups with your rheumatologist. We will perform PFT's as above.   INSTRUCTIONS:  Please schedule a follow-up CT chest scan for the end of April 2025. Additionally, we will arrange for pulmonary function tests to establish your baseline lung function.

## 2023-10-06 NOTE — Assessment & Plan Note (Signed)
4 mm right middle lobe pulmonary nodule that was present in 2014, appears to be unchanged based on the report from October 2024.  I will obtain the actual images to review directly.  Suspect that this is related to autoimmune inflammatory disease.  She is low risk for lung cancer and stability means we can characterize this nodule as benign.  We will have an opportunity to follow it since I want to follow her CT chest for ILD  Pulmonary Nodule Stable 4mm right middle lobe nodule since 2014. No risk factors for lung cancer. -Continue surveillance with repeat CT chest in 6 months (end of April 2025).

## 2023-10-06 NOTE — Progress Notes (Signed)
Subjective:    Patient ID: Abigail Hayes, female    DOB: [birth year], 63 y.o.   MRN: [id number]  HPI The patient, a 63 year old non-smoker with a history of psoriatic arthritis, right lumbosacral radiculopathy, and osteoporosis, presents for discussion of abnormal chest imaging. The patient is currently being treated with [facility] for psoriatic arthritis. The abnormal imaging was discovered during a CT scan of the abdomen and pelvis in September 2024, which revealed a 5mm right middle lobe pulmonary nodule and interstitial changes at both lung bases. A subsequent CT chest confirmed the presence of a 4mm right middle lobe nodule.  The initial CT scan was prompted by persistent pain in the patient's upper quadrant and following the removal of a noncancerous ovarian mass. Despite the removal of the mass and ovaries, the patient continues to experience pain. The patient also reports a history of pain in the right upper quadrant, which has persisted for approximately two years.  The patient expresses concern about the pulmonary nodules discovered on the CT scan, particularly given a family history of lung disease. The patient's mother passed away from a mysterious lung condition, which began with symptoms similar to a cold and rapidly progressed to severe lung, heart, and kidney problems.  The patient also reports occasional wheezing and difficulty exhaling, describing it as an "itching" sensation that improves with coughing. 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 Exposure to alpha-gal reportedly affects the patient's chest first and leads to an asthma-like syndrome.  The patient has a history of smoking marijuana in the 1970s but denies any significant occupational or environmental exposures that could affect lung health. 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  The patient has been on several anti-inflammatory medications over the years, including methotrexate and Cosentyx, before switching to [place] in August 2024. The switch was prompted by a severe flare-up of psoriasis, which responded well to [employer]. 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   Enlargement of lymph nodes 07/17/2009   Glaucoma    Osteoporosis 06/19/2020   Ovarian cyst 03/2021   Pre-diabetes    PSORIATIC ARTHRITIS 07/17/2009     Family History  Problem Relation Age of Onset   Breast cancer Mother    Endometrial cancer Mother    Cancer Father        ?tonsillar cancer   Esophageal cancer Father    Ovarian cancer Paternal Aunt    Colon cancer Other    Hyperlipidemia Other    Hypertension Other    Heart disease Other    Prostate cancer Other    Pancreatic cancer Other    Colon polyps Neg Hx    Stomach cancer Neg Hx    Rectal cancer Neg Hx      Social History  Socioeconomic History   Marital status: Married    Spouse name: Not on file   Number of children: 3   Years of education: Not on file   Highest education level: Master's degree (e.g., MA, MS, MEng, MEd, MSW, MBA)  Occupational History   Occupation: Designer, television/film set: [employer]  Tobacco Use   Smoking status: Never   Smokeless tobacco: Never  Vaping Use   Vaping status: Never Used   Substance and Sexual Activity   Alcohol use: Yes    Comment: 1-2 glases of wine with dinner daily   Drug use: No   Sexual activity: Yes    Birth control/protection: Post-menopausal    Comment: Vasectomy  Other Topics Concern   Not on file  Social History Narrative   Right handed   Two story home   Drinks caffeine   Social Drivers of Health   Financial Resource Strain: Low Risk  (11/05/2022)   Overall Financial Resource Strain (CARDIA)    Difficulty of Paying Living Expenses: Not very hard  Food Insecurity: No Food Insecurity (11/05/2022)   Hunger Vital Sign    Worried About Running Out of Food in the Last Year: Never true    Ran Out of Food in the Last Year: Never true  Transportation Needs: No Transportation Needs (11/05/2022)   PRAPARE - Transportation    Lack of Transportation (Medical): No    Lack of Transportation (Non-Medical): No  Physical Activity: Sufficiently Active (11/05/2022)   Exercise Vital Sign    Days of Exercise per Week: 5 days    Minutes of Exercise per Session: 40 min  Stress: No Stress Concern Present (11/05/2022)   [employer] of Occupational Health - Occupational Stress Questionnaire    Feeling of Stress : Only a little  Social Connections: Moderately Integrated (11/05/2022)   Social Connection and Isolation Panel [NHANES]    Frequency of Communication with Friends and Family: More than three times a week    Frequency of Social Gatherings with Friends and Family: More than three times a week    Attends Religious Services: Never    Database administrator or Organizations: Yes    Attends Engineer, structural: More than 4 times per year    Marital Status: Married  Catering manager Violence: Not on file     Allergies  Allergen Reactions   Alpha-Gal Anaphylaxis, Hives and Cough    Galactose-Alpha-1,3-Galactose  Gelatin     Outpatient Medications Prior to Visit  Medication Sig Dispense Refill   alendronate (FOSAMAX) 70 MG tablet Take  70 mg by mouth once a week.     EPINEPHrine (EPIPEN 2-PAK) 0.3 mg/0.3 mL IJ SOAJ injection EpiPen 2-Pak 0.3 mg/0.3 mL injection, auto-injector 1 each 1   ibuprofen (ADVIL,MOTRIN) 200 MG tablet Take 200-400 mg by mouth every 6 (six) hours as needed for pain.     SKYRIZI PEN 150 MG/ML pen 150mg  Subcutaneous every 12weeks for 84 days     SEMAGLUTIDE PO Take by mouth.     No facility-administered medications prior to visit.        Objective:   Physical Exam Vitals:   10/06/23 1318  BP: 120/64  Pulse: 90  SpO2: 97%  Weight: 151 lb 12.8 oz (68.9 kg)  Height: 5\' 4"  (1.626 m)   Gen: Pleasant, well-nourished, in no distress,  normal affect  ENT: No lesions,  mouth clear,  oropharynx clear, no postnasal drip  Neck: No JVD, no stridor  Lungs: No use  of accessory muscles, no crackles or wheezing on normal respiration, no wheeze on forced expiration  Cardiovascular: RRR, heart sounds normal, no murmur or gallops, no peripheral edema  Musculoskeletal: No deformities, no cyanosis or clubbing  Neuro: alert, awake, non focal  Skin: Warm, no lesions or rash       Assessment & Plan:  Pulmonary nodule 4 mm right middle lobe pulmonary nodule that was present in 2014, appears to be unchanged based on the report from October 2024.  I will obtain the actual images to review directly.  Suspect that this is related to autoimmune inflammatory disease.  She is low risk for lung cancer and stability means we can characterize this nodule as benign.  We will have an opportunity to follow it since I want to follow her CT chest for ILD  Pulmonary Nodule Stable 4mm right middle lobe nodule since 2014. No risk factors for lung cancer. -Continue surveillance with repeat CT chest in 6 months (end of April 2025).    ILD (interstitial lung disease) (HCC) Again this was present in 2014 and the does not appear based on her most recent reports that there has been a significant interval change.  It is a UIP  pattern and I suspect that it relates to her autoimmune disease.  I did reassure her that her course has been inconsistent with IPF.  She needs surveillance imaging to ensure no significant change that would correlate with an effective control of her autoimmune disease, drug reaction, opportunistic infection, etc.  We will obtain a repeat high-resolution CT chest and also get baseline PFT  Interstitial Lung Disease Evidence of interstitial changes at both lung bases, possibly related to psoriatic arthritis. No current respiratory symptoms. -Order pulmonary function tests to establish baseline lung function. -Consider potential for autoimmune inflammatory process causing lung tissue inflammation.  Allergy to alpha-gal Given her alpha gal allergy, her seasonal allergies and some of her symptoms that include cough, wheeze we will perform PFT to ensure no evidence for underlying obstructive lung disease.  PSORIATIC ARTHRITIS Psoriatic Arthritis Currently managed with Skyrizi. -Continue current treatment regimen.   Levy Pupa, MD, PhD 10/06/2023, 2:25 PM [facility] [phone] or if no answer before 7:00PM call [phone] For any issues after 7:00PM please call eLink [phone]

## 2023-10-26 ENCOUNTER — Other Ambulatory Visit: Payer: Self-pay | Admitting: Obstetrics and Gynecology

## 2023-10-26 DIAGNOSIS — Z1231 Encounter for screening mammogram for malignant neoplasm of breast: Secondary | ICD-10-CM

## 2023-11-04 ENCOUNTER — Ambulatory Visit: Payer: 59

## 2023-11-11 ENCOUNTER — Ambulatory Visit
Admission: RE | Admit: 2023-11-11 | Discharge: 2023-11-11 | Disposition: A | Discharge status: Home / Self Care | Payer: 59 | Source: Ambulatory Visit | Attending: Obstetrics and Gynecology | Admitting: Obstetrics and Gynecology

## 2023-11-11 DIAGNOSIS — Z1231 Encounter for screening mammogram for malignant neoplasm of breast: Secondary | ICD-10-CM | POA: Diagnosis not present

## 2023-11-11 DIAGNOSIS — R195 Other fecal abnormalities: Secondary | ICD-10-CM | POA: Diagnosis not present

## 2023-11-11 DIAGNOSIS — J849 Interstitial pulmonary disease, unspecified: Secondary | ICD-10-CM | POA: Diagnosis not present

## 2023-11-11 DIAGNOSIS — M25511 Pain in right shoulder: Secondary | ICD-10-CM | POA: Diagnosis not present

## 2023-11-11 DIAGNOSIS — L409 Psoriasis, unspecified: Secondary | ICD-10-CM | POA: Diagnosis not present

## 2023-11-11 DIAGNOSIS — L4059 Other psoriatic arthropathy: Secondary | ICD-10-CM | POA: Diagnosis not present

## 2023-11-11 DIAGNOSIS — Z79899 Other long term (current) drug therapy: Secondary | ICD-10-CM | POA: Diagnosis not present

## 2023-11-11 DIAGNOSIS — E559 Vitamin D deficiency, unspecified: Secondary | ICD-10-CM | POA: Diagnosis not present

## 2023-11-11 DIAGNOSIS — E663 Overweight: Secondary | ICD-10-CM | POA: Diagnosis not present

## 2023-11-11 DIAGNOSIS — Z6825 Body mass index (BMI) 25.0-25.9, adult: Secondary | ICD-10-CM | POA: Diagnosis not present

## 2023-12-15 DIAGNOSIS — M810 Age-related osteoporosis without current pathological fracture: Secondary | ICD-10-CM | POA: Diagnosis not present

## 2023-12-15 DIAGNOSIS — Z01419 Encounter for gynecological examination (general) (routine) without abnormal findings: Secondary | ICD-10-CM | POA: Diagnosis not present

## 2023-12-15 DIAGNOSIS — Z6826 Body mass index (BMI) 26.0-26.9, adult: Secondary | ICD-10-CM | POA: Diagnosis not present

## 2023-12-15 DIAGNOSIS — Z13228 Encounter for screening for other metabolic disorders: Secondary | ICD-10-CM | POA: Diagnosis not present

## 2024-01-04 ENCOUNTER — Other Ambulatory Visit

## 2024-01-11 ENCOUNTER — Ambulatory Visit
Admission: RE | Admit: 2024-01-11 | Discharge: 2024-01-11 | Disposition: A | Discharge status: Home / Self Care | Source: Ambulatory Visit | Attending: Emergency Medicine | Admitting: Emergency Medicine

## 2024-01-11 DIAGNOSIS — J849 Interstitial pulmonary disease, unspecified: Secondary | ICD-10-CM

## 2024-01-11 DIAGNOSIS — J8410 Pulmonary fibrosis, unspecified: Secondary | ICD-10-CM | POA: Diagnosis not present

## 2024-01-19 ENCOUNTER — Ambulatory Visit (INDEPENDENT_AMBULATORY_CARE_PROVIDER_SITE_OTHER): Payer: 59 | Admitting: Emergency Medicine

## 2024-01-19 ENCOUNTER — Encounter: Payer: Self-pay | Admitting: Emergency Medicine

## 2024-01-19 VITALS — BP 128/66 | HR 58 | Ht 64.0 in | Wt 155.0 lb

## 2024-01-19 DIAGNOSIS — J849 Interstitial pulmonary disease, unspecified: Secondary | ICD-10-CM | POA: Diagnosis not present

## 2024-01-19 LAB — PULMONARY FUNCTION TEST
DL/VA % pred: 108 %
DL/VA: 4.56 ml/min/mmHg/L
DLCO cor % pred: 99 %
DLCO cor: 19.84 ml/min/mmHg
DLCO unc % pred: 99 %
DLCO unc: 19.84 ml/min/mmHg
FEF 25-75 Post: 2.84 L/s
FEF 25-75 Pre: 2.77 L/s
FEF2575-%Change-Post: 2 %
FEF2575-%Pred-Post: 127 %
FEF2575-%Pred-Pre: 124 %
FEV1-%Change-Post: 1 %
FEV1-%Pred-Post: 111 %
FEV1-%Pred-Pre: 110 %
FEV1-Post: 2.76 L
FEV1-Pre: 2.72 L
FEV1FVC-%Change-Post: 1 %
FEV1FVC-%Pred-Pre: 103 %
FEV6-%Change-Post: 0 %
FEV6-%Pred-Post: 109 %
FEV6-%Pred-Pre: 109 %
FEV6-Post: 3.39 L
FEV6-Pre: 3.39 L
FEV6FVC-%Change-Post: 0 %
FEV6FVC-%Pred-Post: 103 %
FEV6FVC-%Pred-Pre: 103 %
FVC-%Change-Post: 0 %
FVC-%Pred-Post: 105 %
FVC-%Pred-Pre: 105 %
FVC-Post: 3.4 L
FVC-Pre: 3.39 L
Post FEV1/FVC ratio: 81 %
Post FEV6/FVC ratio: 100 %
Pre FEV1/FVC ratio: 80 %
Pre FEV6/FVC Ratio: 100 %
RV % pred: 81 %
RV: 1.66 L
TLC % pred: 100 %
TLC: 5.06 L

## 2024-01-19 NOTE — Assessment & Plan Note (Signed)
 Likely due to her autoimmune disease.  She is being considered for change in her therapy.  We discussed her PFT and her high-resolution CT scan of the chest in detail.  The PFT are normal and the high-res scan shows some progression of interstitial change with peripheral predominance consistent with her autoimmune disease.  This is not a UIP pattern.  Plan to get under surveillance  We reviewed your CT scan of the chest today.  This shows some mild interval increase in some interstitial inflammation in scar compared with 2014. We reviewed your pulmonary function testing.  These are normal. We will plan to repeat both your high-resolution CT scan of the chest and your pulmonary function testing in 1 year to do surveillance. Please call our office if you develop any new breathing symptoms, shortness of breath or respiratory problems. Follow Dr. Baldwin Levee in [place] 2026 so we can review your testing at that time.

## 2024-01-19 NOTE — Progress Notes (Signed)
 Full PFT performed today.

## 2024-01-19 NOTE — Patient Instructions (Addendum)
 We reviewed your CT scan of the chest today.  This shows some mild interval increase in some interstitial inflammation in scar compared with 2014. We reviewed your pulmonary function testing.  These are normal. We will plan to repeat both your high-resolution CT scan of the chest and your pulmonary function testing in 1 year to do surveillance. Please call our office if you develop any new breathing symptoms, shortness of breath or respiratory problems. Follow Dr. Baldwin Levee in [place] 2026 so we can review your testing at that time.

## 2024-01-19 NOTE — Patient Instructions (Signed)
 Full PFT performed today.

## 2024-01-19 NOTE — Progress Notes (Signed)
 Subjective:    Patient ID: Abigail Hayes, female    DOB: 09/25/1960, 63 y.o.   MRN: [id number]  HPI  ROV 01/19/2024 --63 year old non-smoker with a history of psoriatic arthritis on Skyrizi.  I saw her in January for abnormal chest findings on the CT scan of the abdomen and pelvis.  There was a 5 mm right middle lobe pulmonary nodule and some bibasilar interstitial prominence.  The 4 mm right middle lobe nodule was confirmed on subsequent CT scan of the chest.  The nodule was actually present on scans going all the way back to 2014.  We arranged for a high-resolution CT scan of the chest and pulmonary function testing to evaluate her degree of ILD. She reports that her arthritis and skin findings are not completely controlled  High-resolution CT scan of the chest done 01/11/2024 reviewed by me, shows mild interstitial disease more prominent at the apices with some peripheral septal thickening and subpleural bronchiolectasis without overt honeycombing.  There [place] be some very mild progression compared with 12/2012.  This is not a UIP pattern.  Pulmonary function testing performed today and reviewed by me show normal airflows without a bronchodilator response, normal lung volumes, normal diffusion capacity.  The flow-volume loop is normal as well.    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   Enlargement of lymph nodes 07/17/2009   Glaucoma    Osteoporosis 06/19/2020   Ovarian cyst 03/2021   Pre-diabetes    PSORIATIC ARTHRITIS 07/17/2009     Family History  Problem Relation Age of Onset   Breast cancer Mother    Endometrial cancer Mother    Cancer Father        ?tonsillar cancer   Esophageal cancer Father    Ovarian cancer Paternal Aunt    Colon cancer Other    Hyperlipidemia Other    Hypertension Other    Heart disease Other    Prostate cancer Other    Pancreatic  cancer Other    Colon polyps Neg Hx    Stomach cancer Neg Hx    Rectal cancer Neg Hx      Social History   Socioeconomic History   Marital status: Married    Spouse name: Not on file   Number of children: 3   Years of education: Not on file   Highest education level: Master's degree (e.g., MA, MS, MEng, MEd, MSW, MBA)  Occupational History   Occupation: Designer, television/film set: [employer]  Tobacco Use   Smoking status: Never   Smokeless tobacco: Never  Vaping Use   Vaping status: Never Used  Substance and Sexual Activity   Alcohol use: Yes    Comment: 1-2 glases of wine with dinner daily   Drug use: No   Sexual activity: Yes    Birth control/protection: Post-menopausal    Comment: Vasectomy  Other Topics Concern   Not on file  Social History Narrative   Right handed   Two story home   Drinks caffeine   Social Drivers of Health   Financial Resource Strain: Low Risk  (11/05/2022)   Overall Financial Resource Strain (CARDIA)    Difficulty of Paying Living Expenses: Not very hard  Food Insecurity: No Food Insecurity (11/05/2022)   Hunger Vital Sign    Worried About Running Out of Food in the Last Year: Never true  Ran Out of Food in the Last Year: Never true  Transportation Needs: No Transportation Needs (11/05/2022)   PRAPARE - Administrator, Civil Service (Medical): No    Lack of Transportation (Non-Medical): No  Physical Activity: Sufficiently Active (11/05/2022)   Exercise Vital Sign    Days of Exercise per Week: 5 days    Minutes of Exercise per Session: 40 min  Stress: No Stress Concern Present (11/05/2022)   [employer] of Occupational Health - Occupational Stress Questionnaire    Feeling of Stress : Only a little  Social Connections: Moderately Integrated (11/05/2022)   Social Connection and Isolation Panel [NHANES]    Frequency of Communication with Friends and Family: More than three times a week    Frequency of Social Gatherings with Friends  and Family: More than three times a week    Attends Religious Services: Never    Database administrator or Organizations: Yes    Attends Engineer, structural: More than 4 times per year    Marital Status: Married  Catering manager Violence: Not on file     Allergies  Allergen Reactions   Alpha-Gal Anaphylaxis, Hives and Cough    Galactose-Alpha-1,3-Galactose  Gelatin     Outpatient Medications Prior to Visit  Medication Sig Dispense Refill   alendronate (FOSAMAX) 70 MG tablet Take 70 mg by mouth once a week.     EPINEPHrine  (EPIPEN  2-PAK) 0.3 mg/0.3 mL IJ SOAJ injection EpiPen  2-Pak 0.3 mg/0.3 mL injection, auto-injector 1 each 1   ibuprofen (ADVIL,MOTRIN) 200 MG tablet Take 200-400 mg by mouth every 6 (six) hours as needed for pain.     SKYRIZI PEN 150 MG/ML pen 150mg  Subcutaneous every 12weeks for 84 days     SEMAGLUTIDE PO Take by mouth.     No facility-administered medications prior to visit.        Objective:   Physical Exam Vitals:   01/19/24 1524  BP: 128/66  Pulse: (!) 58  SpO2: 97%  Weight: 155 lb (70.3 kg)  Height: 5\' 4"  (1.626 m)   Gen: Pleasant, well-nourished, in no distress,  normal affect  ENT: No lesions,  mouth clear,  oropharynx clear, no postnasal drip  Neck: No JVD, no stridor  Lungs: No use of accessory muscles, bibasilar inspiratory crackles  Cardiovascular: RRR, heart sounds normal, no murmur or gallops, no peripheral edema  Musculoskeletal: No deformities, no cyanosis or clubbing  Neuro: alert, awake, non focal  Skin: Warm, no lesions or rash       Assessment & Plan:  ILD (interstitial lung disease) (HCC) Likely due to her autoimmune disease.  She is being considered for change in her therapy.  We discussed her PFT and her high-resolution CT scan of the chest in detail.  The PFT are normal and the high-res scan shows some progression of interstitial change with peripheral predominance consistent with her autoimmune disease.   This is not a UIP pattern.  Plan to get under surveillance  We reviewed your CT scan of the chest today.  This shows some mild interval increase in some interstitial inflammation in scar compared with 2014. We reviewed your pulmonary function testing.  These are normal. We will plan to repeat both your high-resolution CT scan of the chest and your pulmonary function testing in 1 year to do surveillance. Please call our office if you develop any new breathing symptoms, shortness of breath or respiratory problems. Follow Dr. Baldwin Levee in [place] 2026 so we can review  your testing at that time.  Time spent 41 minutes  Racheal Buddle, MD, PhD 01/19/2024, 4:18 PM [facility] [phone] or if no answer before 7:00PM call [phone] For any issues after 7:00PM please call eLink [phone]

## 2024-02-22 DIAGNOSIS — J3089 Other allergic rhinitis: Secondary | ICD-10-CM | POA: Diagnosis not present

## 2024-02-22 DIAGNOSIS — J301 Allergic rhinitis due to pollen: Secondary | ICD-10-CM | POA: Diagnosis not present

## 2024-02-22 DIAGNOSIS — J3081 Allergic rhinitis due to animal (cat) (dog) hair and dander: Secondary | ICD-10-CM | POA: Diagnosis not present

## 2024-02-22 DIAGNOSIS — H1045 Other chronic allergic conjunctivitis: Secondary | ICD-10-CM | POA: Diagnosis not present

## 2024-03-01 DIAGNOSIS — J3081 Allergic rhinitis due to animal (cat) (dog) hair and dander: Secondary | ICD-10-CM | POA: Diagnosis not present

## 2024-03-01 DIAGNOSIS — J301 Allergic rhinitis due to pollen: Secondary | ICD-10-CM | POA: Diagnosis not present

## 2024-03-02 DIAGNOSIS — J301 Allergic rhinitis due to pollen: Secondary | ICD-10-CM | POA: Diagnosis not present

## 2024-03-02 DIAGNOSIS — J3089 Other allergic rhinitis: Secondary | ICD-10-CM | POA: Diagnosis not present

## 2024-03-02 DIAGNOSIS — J3081 Allergic rhinitis due to animal (cat) (dog) hair and dander: Secondary | ICD-10-CM | POA: Diagnosis not present

## 2024-03-06 DIAGNOSIS — J3081 Allergic rhinitis due to animal (cat) (dog) hair and dander: Secondary | ICD-10-CM | POA: Diagnosis not present

## 2024-03-06 DIAGNOSIS — J3089 Other allergic rhinitis: Secondary | ICD-10-CM | POA: Diagnosis not present

## 2024-03-06 DIAGNOSIS — Z91014 Allergy to mammalian meats: Secondary | ICD-10-CM | POA: Diagnosis not present

## 2024-03-06 DIAGNOSIS — J301 Allergic rhinitis due to pollen: Secondary | ICD-10-CM | POA: Diagnosis not present

## 2024-03-08 DIAGNOSIS — J3081 Allergic rhinitis due to animal (cat) (dog) hair and dander: Secondary | ICD-10-CM | POA: Diagnosis not present

## 2024-03-08 DIAGNOSIS — J301 Allergic rhinitis due to pollen: Secondary | ICD-10-CM | POA: Diagnosis not present

## 2024-03-08 DIAGNOSIS — J3089 Other allergic rhinitis: Secondary | ICD-10-CM | POA: Diagnosis not present

## 2024-03-10 DIAGNOSIS — J3081 Allergic rhinitis due to animal (cat) (dog) hair and dander: Secondary | ICD-10-CM | POA: Diagnosis not present

## 2024-03-10 DIAGNOSIS — J3089 Other allergic rhinitis: Secondary | ICD-10-CM | POA: Diagnosis not present

## 2024-03-10 DIAGNOSIS — J301 Allergic rhinitis due to pollen: Secondary | ICD-10-CM | POA: Diagnosis not present

## 2024-03-13 DIAGNOSIS — J301 Allergic rhinitis due to pollen: Secondary | ICD-10-CM | POA: Diagnosis not present

## 2024-03-13 DIAGNOSIS — J3089 Other allergic rhinitis: Secondary | ICD-10-CM | POA: Diagnosis not present

## 2024-03-13 DIAGNOSIS — J3081 Allergic rhinitis due to animal (cat) (dog) hair and dander: Secondary | ICD-10-CM | POA: Diagnosis not present

## 2024-03-15 DIAGNOSIS — J3089 Other allergic rhinitis: Secondary | ICD-10-CM | POA: Diagnosis not present

## 2024-03-15 DIAGNOSIS — J301 Allergic rhinitis due to pollen: Secondary | ICD-10-CM | POA: Diagnosis not present

## 2024-03-15 DIAGNOSIS — J3081 Allergic rhinitis due to animal (cat) (dog) hair and dander: Secondary | ICD-10-CM | POA: Diagnosis not present

## 2024-03-22 DIAGNOSIS — J3081 Allergic rhinitis due to animal (cat) (dog) hair and dander: Secondary | ICD-10-CM | POA: Diagnosis not present

## 2024-03-22 DIAGNOSIS — J301 Allergic rhinitis due to pollen: Secondary | ICD-10-CM | POA: Diagnosis not present

## 2024-03-22 DIAGNOSIS — J3089 Other allergic rhinitis: Secondary | ICD-10-CM | POA: Diagnosis not present

## 2024-03-24 DIAGNOSIS — J3081 Allergic rhinitis due to animal (cat) (dog) hair and dander: Secondary | ICD-10-CM | POA: Diagnosis not present

## 2024-03-24 DIAGNOSIS — J3089 Other allergic rhinitis: Secondary | ICD-10-CM | POA: Diagnosis not present

## 2024-03-24 DIAGNOSIS — J301 Allergic rhinitis due to pollen: Secondary | ICD-10-CM | POA: Diagnosis not present

## 2024-03-27 DIAGNOSIS — J3081 Allergic rhinitis due to animal (cat) (dog) hair and dander: Secondary | ICD-10-CM | POA: Diagnosis not present

## 2024-03-27 DIAGNOSIS — J3089 Other allergic rhinitis: Secondary | ICD-10-CM | POA: Diagnosis not present

## 2024-03-27 DIAGNOSIS — J301 Allergic rhinitis due to pollen: Secondary | ICD-10-CM | POA: Diagnosis not present

## 2024-03-29 DIAGNOSIS — J3081 Allergic rhinitis due to animal (cat) (dog) hair and dander: Secondary | ICD-10-CM | POA: Diagnosis not present

## 2024-03-29 DIAGNOSIS — J3089 Other allergic rhinitis: Secondary | ICD-10-CM | POA: Diagnosis not present

## 2024-03-29 DIAGNOSIS — J301 Allergic rhinitis due to pollen: Secondary | ICD-10-CM | POA: Diagnosis not present

## 2024-03-31 DIAGNOSIS — J3081 Allergic rhinitis due to animal (cat) (dog) hair and dander: Secondary | ICD-10-CM | POA: Diagnosis not present

## 2024-03-31 DIAGNOSIS — J301 Allergic rhinitis due to pollen: Secondary | ICD-10-CM | POA: Diagnosis not present

## 2024-03-31 DIAGNOSIS — J3089 Other allergic rhinitis: Secondary | ICD-10-CM | POA: Diagnosis not present

## 2024-04-03 DIAGNOSIS — J3089 Other allergic rhinitis: Secondary | ICD-10-CM | POA: Diagnosis not present

## 2024-04-03 DIAGNOSIS — J3081 Allergic rhinitis due to animal (cat) (dog) hair and dander: Secondary | ICD-10-CM | POA: Diagnosis not present

## 2024-04-03 DIAGNOSIS — J301 Allergic rhinitis due to pollen: Secondary | ICD-10-CM | POA: Diagnosis not present

## 2024-04-05 DIAGNOSIS — J301 Allergic rhinitis due to pollen: Secondary | ICD-10-CM | POA: Diagnosis not present

## 2024-04-05 DIAGNOSIS — J3081 Allergic rhinitis due to animal (cat) (dog) hair and dander: Secondary | ICD-10-CM | POA: Diagnosis not present

## 2024-04-05 DIAGNOSIS — J3089 Other allergic rhinitis: Secondary | ICD-10-CM | POA: Diagnosis not present

## 2024-04-07 DIAGNOSIS — J3089 Other allergic rhinitis: Secondary | ICD-10-CM | POA: Diagnosis not present

## 2024-04-07 DIAGNOSIS — J3081 Allergic rhinitis due to animal (cat) (dog) hair and dander: Secondary | ICD-10-CM | POA: Diagnosis not present

## 2024-04-07 DIAGNOSIS — J301 Allergic rhinitis due to pollen: Secondary | ICD-10-CM | POA: Diagnosis not present

## 2024-04-10 DIAGNOSIS — J3081 Allergic rhinitis due to animal (cat) (dog) hair and dander: Secondary | ICD-10-CM | POA: Diagnosis not present

## 2024-04-10 DIAGNOSIS — J301 Allergic rhinitis due to pollen: Secondary | ICD-10-CM | POA: Diagnosis not present

## 2024-04-10 DIAGNOSIS — J3089 Other allergic rhinitis: Secondary | ICD-10-CM | POA: Diagnosis not present

## 2024-04-12 DIAGNOSIS — J3081 Allergic rhinitis due to animal (cat) (dog) hair and dander: Secondary | ICD-10-CM | POA: Diagnosis not present

## 2024-04-12 DIAGNOSIS — J3089 Other allergic rhinitis: Secondary | ICD-10-CM | POA: Diagnosis not present

## 2024-04-12 DIAGNOSIS — J301 Allergic rhinitis due to pollen: Secondary | ICD-10-CM | POA: Diagnosis not present

## 2024-04-17 DIAGNOSIS — J3081 Allergic rhinitis due to animal (cat) (dog) hair and dander: Secondary | ICD-10-CM | POA: Diagnosis not present

## 2024-04-17 DIAGNOSIS — J301 Allergic rhinitis due to pollen: Secondary | ICD-10-CM | POA: Diagnosis not present

## 2024-04-17 DIAGNOSIS — J3089 Other allergic rhinitis: Secondary | ICD-10-CM | POA: Diagnosis not present

## 2024-04-24 DIAGNOSIS — J3081 Allergic rhinitis due to animal (cat) (dog) hair and dander: Secondary | ICD-10-CM | POA: Diagnosis not present

## 2024-04-24 DIAGNOSIS — J301 Allergic rhinitis due to pollen: Secondary | ICD-10-CM | POA: Diagnosis not present

## 2024-04-24 DIAGNOSIS — J3089 Other allergic rhinitis: Secondary | ICD-10-CM | POA: Diagnosis not present

## 2024-05-01 DIAGNOSIS — J3081 Allergic rhinitis due to animal (cat) (dog) hair and dander: Secondary | ICD-10-CM | POA: Diagnosis not present

## 2024-05-01 DIAGNOSIS — J3089 Other allergic rhinitis: Secondary | ICD-10-CM | POA: Diagnosis not present

## 2024-05-01 DIAGNOSIS — J301 Allergic rhinitis due to pollen: Secondary | ICD-10-CM | POA: Diagnosis not present

## 2024-05-09 DIAGNOSIS — J3081 Allergic rhinitis due to animal (cat) (dog) hair and dander: Secondary | ICD-10-CM | POA: Diagnosis not present

## 2024-05-09 DIAGNOSIS — J301 Allergic rhinitis due to pollen: Secondary | ICD-10-CM | POA: Diagnosis not present

## 2024-05-09 DIAGNOSIS — J3089 Other allergic rhinitis: Secondary | ICD-10-CM | POA: Diagnosis not present

## 2024-05-11 DIAGNOSIS — M25511 Pain in right shoulder: Secondary | ICD-10-CM | POA: Diagnosis not present

## 2024-05-11 DIAGNOSIS — Z111 Encounter for screening for respiratory tuberculosis: Secondary | ICD-10-CM | POA: Diagnosis not present

## 2024-05-11 DIAGNOSIS — L409 Psoriasis, unspecified: Secondary | ICD-10-CM | POA: Diagnosis not present

## 2024-05-11 DIAGNOSIS — L4059 Other psoriatic arthropathy: Secondary | ICD-10-CM | POA: Diagnosis not present

## 2024-05-11 DIAGNOSIS — E559 Vitamin D deficiency, unspecified: Secondary | ICD-10-CM | POA: Diagnosis not present

## 2024-05-11 DIAGNOSIS — R195 Other fecal abnormalities: Secondary | ICD-10-CM | POA: Diagnosis not present

## 2024-05-11 DIAGNOSIS — Z6827 Body mass index (BMI) 27.0-27.9, adult: Secondary | ICD-10-CM | POA: Diagnosis not present

## 2024-05-11 DIAGNOSIS — Z79899 Other long term (current) drug therapy: Secondary | ICD-10-CM | POA: Diagnosis not present

## 2024-05-11 DIAGNOSIS — J849 Interstitial pulmonary disease, unspecified: Secondary | ICD-10-CM | POA: Diagnosis not present

## 2024-05-11 DIAGNOSIS — E663 Overweight: Secondary | ICD-10-CM | POA: Diagnosis not present

## 2024-05-23 DIAGNOSIS — J3089 Other allergic rhinitis: Secondary | ICD-10-CM | POA: Diagnosis not present

## 2024-05-23 DIAGNOSIS — J301 Allergic rhinitis due to pollen: Secondary | ICD-10-CM | POA: Diagnosis not present

## 2024-05-23 DIAGNOSIS — J3081 Allergic rhinitis due to animal (cat) (dog) hair and dander: Secondary | ICD-10-CM | POA: Diagnosis not present

## 2024-05-30 DIAGNOSIS — J3081 Allergic rhinitis due to animal (cat) (dog) hair and dander: Secondary | ICD-10-CM | POA: Diagnosis not present

## 2024-05-30 DIAGNOSIS — J301 Allergic rhinitis due to pollen: Secondary | ICD-10-CM | POA: Diagnosis not present

## 2024-05-30 DIAGNOSIS — J3089 Other allergic rhinitis: Secondary | ICD-10-CM | POA: Diagnosis not present

## 2024-06-06 DIAGNOSIS — J3089 Other allergic rhinitis: Secondary | ICD-10-CM | POA: Diagnosis not present

## 2024-06-06 DIAGNOSIS — J3081 Allergic rhinitis due to animal (cat) (dog) hair and dander: Secondary | ICD-10-CM | POA: Diagnosis not present

## 2024-06-06 DIAGNOSIS — J301 Allergic rhinitis due to pollen: Secondary | ICD-10-CM | POA: Diagnosis not present

## 2024-06-13 DIAGNOSIS — J3081 Allergic rhinitis due to animal (cat) (dog) hair and dander: Secondary | ICD-10-CM | POA: Diagnosis not present

## 2024-06-13 DIAGNOSIS — J301 Allergic rhinitis due to pollen: Secondary | ICD-10-CM | POA: Diagnosis not present

## 2024-06-13 DIAGNOSIS — J3089 Other allergic rhinitis: Secondary | ICD-10-CM | POA: Diagnosis not present

## 2024-06-20 DIAGNOSIS — J3081 Allergic rhinitis due to animal (cat) (dog) hair and dander: Secondary | ICD-10-CM | POA: Diagnosis not present

## 2024-06-20 DIAGNOSIS — J3089 Other allergic rhinitis: Secondary | ICD-10-CM | POA: Diagnosis not present

## 2024-06-20 DIAGNOSIS — J301 Allergic rhinitis due to pollen: Secondary | ICD-10-CM | POA: Diagnosis not present

## 2024-07-11 DIAGNOSIS — J301 Allergic rhinitis due to pollen: Secondary | ICD-10-CM | POA: Diagnosis not present

## 2024-07-11 DIAGNOSIS — J3081 Allergic rhinitis due to animal (cat) (dog) hair and dander: Secondary | ICD-10-CM | POA: Diagnosis not present

## 2024-07-11 DIAGNOSIS — J3089 Other allergic rhinitis: Secondary | ICD-10-CM | POA: Diagnosis not present

## 2024-07-13 DIAGNOSIS — J3081 Allergic rhinitis due to animal (cat) (dog) hair and dander: Secondary | ICD-10-CM | POA: Diagnosis not present

## 2024-07-13 DIAGNOSIS — J301 Allergic rhinitis due to pollen: Secondary | ICD-10-CM | POA: Diagnosis not present

## 2024-07-13 DIAGNOSIS — J3089 Other allergic rhinitis: Secondary | ICD-10-CM | POA: Diagnosis not present

## 2024-07-18 DIAGNOSIS — J3089 Other allergic rhinitis: Secondary | ICD-10-CM | POA: Diagnosis not present

## 2024-07-18 DIAGNOSIS — J3081 Allergic rhinitis due to animal (cat) (dog) hair and dander: Secondary | ICD-10-CM | POA: Diagnosis not present

## 2024-07-18 DIAGNOSIS — J301 Allergic rhinitis due to pollen: Secondary | ICD-10-CM | POA: Diagnosis not present

## 2024-07-27 DIAGNOSIS — D1801 Hemangioma of skin and subcutaneous tissue: Secondary | ICD-10-CM | POA: Diagnosis not present

## 2024-07-27 DIAGNOSIS — L400 Psoriasis vulgaris: Secondary | ICD-10-CM | POA: Diagnosis not present

## 2024-07-27 DIAGNOSIS — L814 Other melanin hyperpigmentation: Secondary | ICD-10-CM | POA: Diagnosis not present

## 2024-07-27 DIAGNOSIS — L821 Other seborrheic keratosis: Secondary | ICD-10-CM | POA: Diagnosis not present

## 2024-08-01 DIAGNOSIS — J301 Allergic rhinitis due to pollen: Secondary | ICD-10-CM | POA: Diagnosis not present

## 2024-08-01 DIAGNOSIS — J3081 Allergic rhinitis due to animal (cat) (dog) hair and dander: Secondary | ICD-10-CM | POA: Diagnosis not present

## 2024-08-01 DIAGNOSIS — J3089 Other allergic rhinitis: Secondary | ICD-10-CM | POA: Diagnosis not present

## 2024-08-15 DIAGNOSIS — J3081 Allergic rhinitis due to animal (cat) (dog) hair and dander: Secondary | ICD-10-CM | POA: Diagnosis not present

## 2024-08-15 DIAGNOSIS — Z79899 Other long term (current) drug therapy: Secondary | ICD-10-CM | POA: Diagnosis not present

## 2024-08-15 DIAGNOSIS — E663 Overweight: Secondary | ICD-10-CM | POA: Diagnosis not present

## 2024-08-15 DIAGNOSIS — E559 Vitamin D deficiency, unspecified: Secondary | ICD-10-CM | POA: Diagnosis not present

## 2024-08-15 DIAGNOSIS — R195 Other fecal abnormalities: Secondary | ICD-10-CM | POA: Diagnosis not present

## 2024-08-15 DIAGNOSIS — M25511 Pain in right shoulder: Secondary | ICD-10-CM | POA: Diagnosis not present

## 2024-08-15 DIAGNOSIS — Z6827 Body mass index (BMI) 27.0-27.9, adult: Secondary | ICD-10-CM | POA: Diagnosis not present

## 2024-08-15 DIAGNOSIS — L409 Psoriasis, unspecified: Secondary | ICD-10-CM | POA: Diagnosis not present

## 2024-08-15 DIAGNOSIS — J3089 Other allergic rhinitis: Secondary | ICD-10-CM | POA: Diagnosis not present

## 2024-08-15 DIAGNOSIS — J849 Interstitial pulmonary disease, unspecified: Secondary | ICD-10-CM | POA: Diagnosis not present

## 2024-08-15 DIAGNOSIS — J301 Allergic rhinitis due to pollen: Secondary | ICD-10-CM | POA: Diagnosis not present

## 2024-08-15 DIAGNOSIS — Z13220 Encounter for screening for lipoid disorders: Secondary | ICD-10-CM | POA: Diagnosis not present

## 2024-08-15 DIAGNOSIS — L4059 Other psoriatic arthropathy: Secondary | ICD-10-CM | POA: Diagnosis not present

## 2024-08-23 ENCOUNTER — Ambulatory Visit: Payer: Self-pay

## 2024-08-23 NOTE — Telephone Encounter (Signed)
 FYI Only or Action Required?: FYI only for provider: appointment scheduled on 08/25/24.  Patient was last seen in primary care on 07/21/2023 by Micheal Wolm ORN, MD.  Called Nurse Triage reporting Dizziness and Numbness.  Symptoms began several weeks to months ago.  Interventions attempted: Rest, hydration, or home remedies.  Symptoms are: gradually worsening.  Triage Disposition: See PCP When Office is Open (Within 3 Days)  Patient/caregiver understands and will follow disposition?: Yes            Copied from CRM #[id number]. Topic: Clinical - Red Word Triage >> Aug 23, 2024  8:46 AM Revonda D wrote: Red Word that prompted transfer to Nurse Triage: dizziness, numbness  Pt stated that she is experiencing dizziness and has numbness in her right leg. Pt would like to schedule an appt with her provider.    ----------------------------------------------------------------------- From previous Reason for Contact - Scheduling: Patient/patient representative is calling to schedule an appointment. Refer to attachments for appointment information. Reason for Disposition  [1] MILD dizziness (e.g., walking normally) AND [2] has NOT been evaluated by doctor (or NP/PA) for this  (Exception: Dizziness caused by heat exposure, sudden standing, or poor fluid intake.)  Answer Assessment - Initial Assessment Questions 1. DESCRIPTION: Describe your dizziness.     Head pressure, lightheaded and woozy.  2. LIGHTHEADED: Do you feel lightheaded? (e.g., somewhat faint, woozy, weak upon standing)     Yes.  3. VERTIGO: Do you feel like either you or the room is spinning or tilting? (i.e., vertigo)     No.  4. SEVERITY: How bad is it?  Do you feel like you are going to faint? Can you stand and walk?     Mild. No LOC, holding onto objects for assistance or falls.  5. ONSET:  When did the dizziness begin?     X 3 weeks ago.  6. AGGRAVATING FACTORS: Does anything make it worse?  (e.g., standing, change in head position)     She feels it [place] be related to not drinking enough fluids, worse after a long car ride and not drinking enough. She states change in head position, sitting, standing or lying down changes it.  7. HEART RATE: Can you tell me your heart rate? How many beats in 15 seconds?  (Note: Not all patients can do this.)       N/A.  8. CAUSE: What do you think is causing the dizziness? (e.g., decreased fluids or food, diarrhea, emotional distress, heat exposure, new medicine, sudden standing, vomiting; unknown)     Decreased fluids.  9. RECURRENT SYMPTOM: Have you had dizziness before? If Yes, ask: When was the last time? What happened that time?     No.  10. OTHER SYMPTOMS: Do you have any other symptoms? (e.g., fever, chest pain, vomiting, diarrhea, bleeding)       Right lower back pain with numbness that radiates from groin down back of right leg x several years and gradually worsening feels like a pinched nerve;  No cough, nasal congestion, fever, SOB, chest pain, unilateral numbness or weakness, changes in speech or visions, headaches, vomiting.  Protocols used: Dizziness - Lightheadedness-A-AH

## 2024-08-25 ENCOUNTER — Encounter: Payer: Self-pay | Admitting: Family Medicine

## 2024-08-25 ENCOUNTER — Ambulatory Visit (INDEPENDENT_AMBULATORY_CARE_PROVIDER_SITE_OTHER)

## 2024-08-25 ENCOUNTER — Ambulatory Visit: Admitting: Family Medicine

## 2024-08-25 VITALS — BP 144/82 | HR 79 | Temp 98.3°F | Wt 163.3 lb

## 2024-08-25 DIAGNOSIS — R7303 Prediabetes: Secondary | ICD-10-CM

## 2024-08-25 DIAGNOSIS — M545 Low back pain, unspecified: Secondary | ICD-10-CM

## 2024-08-25 DIAGNOSIS — J301 Allergic rhinitis due to pollen: Secondary | ICD-10-CM | POA: Diagnosis not present

## 2024-08-25 DIAGNOSIS — R42 Dizziness and giddiness: Secondary | ICD-10-CM

## 2024-08-25 DIAGNOSIS — J3089 Other allergic rhinitis: Secondary | ICD-10-CM | POA: Diagnosis not present

## 2024-08-25 DIAGNOSIS — M4316 Spondylolisthesis, lumbar region: Secondary | ICD-10-CM | POA: Diagnosis not present

## 2024-08-25 DIAGNOSIS — M25551 Pain in right hip: Secondary | ICD-10-CM

## 2024-08-25 DIAGNOSIS — M48061 Spinal stenosis, lumbar region without neurogenic claudication: Secondary | ICD-10-CM | POA: Diagnosis not present

## 2024-08-25 DIAGNOSIS — J3081 Allergic rhinitis due to animal (cat) (dog) hair and dander: Secondary | ICD-10-CM | POA: Diagnosis not present

## 2024-08-25 DIAGNOSIS — M5417 Radiculopathy, lumbosacral region: Secondary | ICD-10-CM

## 2024-08-25 DIAGNOSIS — M47816 Spondylosis without myelopathy or radiculopathy, lumbar region: Secondary | ICD-10-CM | POA: Diagnosis not present

## 2024-08-25 LAB — BASIC METABOLIC PANEL WITH GFR
BUN: 20 mg/dL (ref 6–23)
CO2: 28 meq/L (ref 19–32)
Calcium: 9.7 mg/dL (ref 8.4–10.5)
Chloride: 103 meq/L (ref 96–112)
Creatinine, Ser: 0.82 mg/dL (ref 0.40–1.20)
GFR: 75.65 mL/min (ref >60.00)
Glucose, Bld: 86 mg/dL (ref 70–99)
Potassium: 5 meq/L (ref 3.5–5.1)
Sodium: 139 meq/L (ref 135–145)

## 2024-08-25 LAB — HEMOGLOBIN A1C: Hgb A1c MFr Bld: 5.9 % (ref 4.6–6.5)

## 2024-08-25 NOTE — Progress Notes (Signed)
 Established Patient Office Visit  Subjective   Patient ID: Abigail Hayes, female    DOB: Jun 23, 1961  Age: 63 y.o. MRN: [id number]  Chief Complaint  Patient presents with   Back Pain   Dizziness   Leg Pain   Hand Pain    HPI   Abigail Hayes has history of psoriatic arthritis, osteoporosis, chronic right lumbosacral radiculopathy.  She has been followed by back specialist since 2018.  She states she has scoliosis and spondylolisthesis history.  Denies any MRI scans of her back since 2018.  She stays quite active and does hiking still but has had some recent progressive pains into her right buttock and down her right lower extremity.  No recent progressive numbness or weakness.  No urine or stool incontinence.  She is tried multiple stretches including for the piriformis without any real improvement.  No improvement with medications.  Denies any recent fall or specific injury.  She wonders if some of this Foos be hip related.  No recent hip films.  She has also had some recent fleeting episodes of dizziness.  This occurs just seated and not positionally.  No syncope.  No palpitations.  No acute visual changes.  No focal weakness.  Her current medications include   Rinvoq and Fosamax.  She states she is getting fairly regular labs with CBC and comprehensive metabolic panel through her rheumatologist.  She does states she has had previous elevated A1c which at 1 point was over 6 but she thinks last value was down some.  She would like to consider getting this rechecked.  No significant polyuria or polydipsia.  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 She has never used smokeless tobacco. She reports current alcohol use. She reports that she does not use drugs. family history includes Breast cancer in her mother; Cancer in her father; Colon cancer in an other family member; Endometrial cancer in her mother; Esophageal cancer in her father; Heart disease in an other family member; Hyperlipidemia in an other family member; Hypertension in an other family member; Ovarian cancer in her paternal aunt; Pancreatic cancer in an other family member; Prostate cancer in an other family member. Allergies[1]  Review of Systems  Constitutional:  Negative for chills, fever and malaise/fatigue.  Eyes:  Negative for blurred vision.  Respiratory:  Negative for shortness of breath.   Cardiovascular:  Negative for chest pain.  Musculoskeletal:  Positive for back pain.  Neurological:  Positive for dizziness. Negative for speech change, focal weakness, loss of consciousness, weakness and headaches.      Objective:     BP (!) 144/82 (BP Location: Left Arm, Cuff Size: Normal)   Pulse 79   Temp 98.3 F (36.8 C) (Oral)   Wt 163 lb 4.8 oz (74.1 kg)   LMP  06/03/2011   SpO2 97%   BMI 28.03 kg/m  BP Readings from Last 3 Encounters:  08/25/24 (!) 144/82  01/19/24 128/66  10/06/23 120/64   Wt Readings from Last 3 Encounters:  08/25/24 163 lb 4.8 oz (74.1 kg)  01/19/24 155 lb (70.3 kg)  10/06/23 151 lb 12.8 oz (68.9 kg)      Physical Exam Vitals reviewed.  Constitutional:      Appearance: She is well-developed.  Eyes:     Pupils: Pupils are equal, round, and reactive to light.  Neck:     Thyroid : No thyromegaly.     Vascular: No JVD.   Cardiovascular:     Rate and Rhythm: Normal rate and regular rhythm.     Heart sounds:     No gallop.  Pulmonary:     Effort: Pulmonary effort is normal. No respiratory distress.     Breath sounds: Normal breath sounds. No wheezing or rales.  Musculoskeletal:     Cervical back: Neck supple.     Comments: Excellent range of motion right hip.  No right lower extremity edema  Neurological:     General: No focal deficit present.     Mental Status: She is alert.     Cranial Nerves: No cranial nerve deficit.     Motor: No weakness.     Coordination: Coordination normal.     Gait: Gait normal.     Comments: Full strength with plantarflexion, dorsiflexion, knee extension, hip flexion on the right.  She has 1+ ankle and knee reflexes bilaterally      No results found for any visits on 08/25/24.    The ASCVD Risk score (Arnett DK, et al., 2019) failed to calculate for the following reasons:   Cannot find a previous HDL lab   Cannot find a previous total cholesterol lab   * - Cholesterol units were assumed    Assessment & Plan:   #1 history of scoliosis and chronic right lumbosacral radiculopathy.  She has had some progressive pains radiating to the right buttock.  Nonfocal neuroexam.  She has seen back specialist but states she has not had MRI of her lumbar spine and several years.  She has tried several conservative things including medications and stretches without improvement.  Will start with repeat plain films lumbar spine and right hip.  [place] need MRI lumbar spine to further clarify.  #2 history of prediabetes range blood sugars.  Recheck A1c today  #3 transient/fleeting dizziness.  Does not sound like clear vertigo.  No syncope.  No palpitations.  She had very transient episode today in office and heart was regular during that episode.  She had carotid Dopplers little over a year ago with no hemodynamically significant stenosis.  Check basic metabolic panel.  She states she had  recent CBC through rheumatologist with no recent anemia history.  Set up follow-up for further evaluation if symptoms persist   No follow-ups on file.    Abigail Scarlet, MD     [1]  Allergies Allergen Reactions   Alpha-Gal Anaphylaxis, Hives and Cough    Galactose-Alpha-1,3-Galactose  Gelatin

## 2024-08-26 ENCOUNTER — Ambulatory Visit: Payer: Self-pay | Admitting: Family Medicine

## 2024-08-28 ENCOUNTER — Encounter: Payer: Self-pay | Admitting: Family Medicine

## 2024-08-29 ENCOUNTER — Encounter: Payer: Self-pay | Admitting: Family Medicine

## 2024-08-29 ENCOUNTER — Telehealth: Payer: Self-pay

## 2024-08-29 ENCOUNTER — Other Ambulatory Visit

## 2024-08-29 NOTE — Telephone Encounter (Signed)
 Copied from CRM #[id number]. Topic: Clinical - Request for Lab/Test Order >> Aug 29, 2024  3:15 PM Drema MATSU wrote: Reason for CRM: Patient is not sure what to do at this point. Insurance denied MRI. Please call patient back.

## 2024-08-30 ENCOUNTER — Encounter: Payer: Self-pay | Admitting: Family Medicine

## 2024-08-30 ENCOUNTER — Telehealth: Payer: Self-pay | Admitting: *Deleted

## 2024-08-30 NOTE — Telephone Encounter (Signed)
 Copied from CRM #[id number]. Topic: Clinical - Request for Lab/Test Order >> Aug 30, 2024  8:52 AM Viola FALCON wrote: Bari from [facility] regarding patients MRI scheduled for 09/04/24 since prior auth was denied she wants to know if Dr. Micheal wants to do peer to peer? Please call her with an update at [phone] ext [phone].  The number for peer to peer is [phone] option 1

## 2024-08-30 NOTE — Telephone Encounter (Signed)
 I spoke with Bari and she reported she is unsure if the call can be conducted after 5:30 pm but a call can be placed to see if peer to peer can be done at that time or if an alternative appointment time can be scheduled with insurance for review

## 2024-08-30 NOTE — Telephone Encounter (Signed)
 I spoke with Abigail Hayes and she reported she is unsure if the call can be conducted after 5:30 pm but a call can be placed to see if peer to per can be done at that time of if an alternative time can be scheduled with insurance

## 2024-08-30 NOTE — Telephone Encounter (Signed)
 Left a message for the Bari to return my call

## 2024-08-31 ENCOUNTER — Telehealth: Payer: Self-pay | Admitting: *Deleted

## 2024-08-31 NOTE — Telephone Encounter (Signed)
 Copied from CRM #[id number]. Topic: Clinical - Request for Lab/Test Order >> Aug 31, 2024  4:16 PM Viola F wrote: Reason for CRM: Etta from [facility] returned Abigail Hayes's call - wants to know if Dr. Micheal will be doing the peer to peer review for the MRI since the prior auth was denied and appt is Monday 09/04/24. Since Bari will be out of the office after today please call Kiara at [phone]

## 2024-08-31 NOTE — Telephone Encounter (Signed)
 Patient informed of the message below.  Patient was advised a number of times to contact Dr Zaida office for follow up as below since she stated she has not been seen by him in years and PCP advised follow up to see if an MRI is warranted at this time-currently.

## 2024-09-01 ENCOUNTER — Encounter: Payer: Self-pay | Admitting: Family Medicine

## 2024-09-01 DIAGNOSIS — J301 Allergic rhinitis due to pollen: Secondary | ICD-10-CM | POA: Diagnosis not present

## 2024-09-01 DIAGNOSIS — J3089 Other allergic rhinitis: Secondary | ICD-10-CM | POA: Diagnosis not present

## 2024-09-01 DIAGNOSIS — T63421D Toxic effect of venom of ants, accidental (unintentional), subsequent encounter: Secondary | ICD-10-CM | POA: Diagnosis not present

## 2024-09-01 DIAGNOSIS — J3081 Allergic rhinitis due to animal (cat) (dog) hair and dander: Secondary | ICD-10-CM | POA: Diagnosis not present

## 2024-09-04 ENCOUNTER — Other Ambulatory Visit

## 2024-09-04 NOTE — Telephone Encounter (Signed)
 Left message for the patient to return my call.

## 2024-09-04 NOTE — Telephone Encounter (Signed)
 Patient informed of the results and voiced understanding. Patient reported she does not want to go back to Dr. Burnetta

## 2024-09-05 NOTE — Telephone Encounter (Signed)
 Left detailed message on patient voicemail informing her of message and to callback if she would like to initiate PT

## 2024-09-08 DIAGNOSIS — J3081 Allergic rhinitis due to animal (cat) (dog) hair and dander: Secondary | ICD-10-CM | POA: Diagnosis not present

## 2024-09-08 DIAGNOSIS — J3089 Other allergic rhinitis: Secondary | ICD-10-CM | POA: Diagnosis not present

## 2024-09-08 DIAGNOSIS — J301 Allergic rhinitis due to pollen: Secondary | ICD-10-CM | POA: Diagnosis not present
